# Patient Record
Sex: Female | Born: 1945
Health system: Southern US, Community
[De-identification: ages and names within clinical notes are randomized; demographics above are authoritative.]

## PROBLEM LIST (undated history)

## (undated) DIAGNOSIS — I10 Essential (primary) hypertension: Secondary | ICD-10-CM

## (undated) DIAGNOSIS — D649 Anemia, unspecified: Secondary | ICD-10-CM

## (undated) DIAGNOSIS — E785 Hyperlipidemia, unspecified: Secondary | ICD-10-CM

## (undated) HISTORY — DX: Hyperlipidemia, unspecified: E78.5

## (undated) HISTORY — DX: Anemia, unspecified: D64.9

## (undated) HISTORY — DX: Essential (primary) hypertension: I10

---

## 1999-04-13 ENCOUNTER — Other Ambulatory Visit: Admission: RE | Admit: 1999-04-13 | Discharge: 1999-04-13 | Payer: Self-pay | Admitting: *Deleted

## 1999-06-13 ENCOUNTER — Encounter (INDEPENDENT_AMBULATORY_CARE_PROVIDER_SITE_OTHER): Payer: Self-pay | Admitting: Specialist

## 1999-06-13 ENCOUNTER — Other Ambulatory Visit: Admission: RE | Admit: 1999-06-13 | Discharge: 1999-06-13 | Payer: Self-pay | Admitting: *Deleted

## 2000-01-01 ENCOUNTER — Other Ambulatory Visit: Admission: RE | Admit: 2000-01-01 | Discharge: 2000-01-01 | Payer: Self-pay | Admitting: *Deleted

## 2000-05-16 ENCOUNTER — Ambulatory Visit (HOSPITAL_COMMUNITY): Admission: RE | Admit: 2000-05-16 | Discharge: 2000-05-16 | Payer: Self-pay | Admitting: Internal Medicine

## 2000-05-16 ENCOUNTER — Encounter: Payer: Self-pay | Admitting: Internal Medicine

## 2000-08-20 ENCOUNTER — Encounter (INDEPENDENT_AMBULATORY_CARE_PROVIDER_SITE_OTHER): Payer: Self-pay | Admitting: Specialist

## 2000-08-20 ENCOUNTER — Other Ambulatory Visit: Admission: RE | Admit: 2000-08-20 | Discharge: 2000-08-20 | Payer: Self-pay | Admitting: *Deleted

## 2001-02-26 ENCOUNTER — Other Ambulatory Visit: Admission: RE | Admit: 2001-02-26 | Discharge: 2001-02-26 | Payer: Self-pay | Admitting: Gynecology

## 2003-06-09 ENCOUNTER — Other Ambulatory Visit: Admission: RE | Admit: 2003-06-09 | Discharge: 2003-06-09 | Payer: Self-pay | Admitting: Gynecology

## 2009-12-19 ENCOUNTER — Emergency Department (HOSPITAL_COMMUNITY): Admission: EM | Admit: 2009-12-19 | Discharge: 2009-12-19 | Payer: Self-pay | Admitting: Emergency Medicine

## 2011-02-07 LAB — POCT I-STAT, CHEM 8
BUN: 9 mg/dL (ref 6–23)
Calcium, Ion: 1.1 mmol/L — ABNORMAL LOW (ref 1.12–1.32)
Chloride: 104 mEq/L (ref 96–112)
Creatinine, Ser: 0.7 mg/dL (ref 0.4–1.2)
Glucose, Bld: 89 mg/dL (ref 70–99)
HCT: 48 % — ABNORMAL HIGH (ref 36.0–46.0)
Hemoglobin: 16.3 g/dL — ABNORMAL HIGH (ref 12.0–15.0)
Potassium: 4 mEq/L (ref 3.5–5.1)
Sodium: 140 mEq/L (ref 135–145)
TCO2: 31 mmol/L (ref 0–100)

## 2011-02-07 LAB — URINALYSIS, ROUTINE W REFLEX MICROSCOPIC
Bilirubin Urine: NEGATIVE
Glucose, UA: NEGATIVE mg/dL
Hgb urine dipstick: NEGATIVE
Ketones, ur: NEGATIVE mg/dL
Nitrite: NEGATIVE
Protein, ur: NEGATIVE mg/dL
Specific Gravity, Urine: 1.004 — ABNORMAL LOW (ref 1.005–1.030)
Urobilinogen, UA: 0.2 mg/dL (ref 0.0–1.0)
pH: 6 (ref 5.0–8.0)

## 2011-02-07 LAB — GLUCOSE, CAPILLARY: Glucose-Capillary: 88 mg/dL (ref 70–99)

## 2013-10-04 ENCOUNTER — Telehealth: Payer: Self-pay | Admitting: *Deleted

## 2013-10-04 NOTE — Telephone Encounter (Signed)
pts calling to see if we have samples of bystolic? For her( please call pt ).

## 2013-10-05 ENCOUNTER — Other Ambulatory Visit: Payer: Self-pay | Admitting: Emergency Medicine

## 2013-11-30 ENCOUNTER — Other Ambulatory Visit: Payer: Self-pay | Admitting: Internal Medicine

## 2014-02-06 ENCOUNTER — Encounter: Payer: Self-pay | Admitting: *Deleted

## 2014-02-06 DIAGNOSIS — I10 Essential (primary) hypertension: Secondary | ICD-10-CM | POA: Insufficient documentation

## 2014-02-06 DIAGNOSIS — E785 Hyperlipidemia, unspecified: Secondary | ICD-10-CM | POA: Insufficient documentation

## 2014-02-06 DIAGNOSIS — D649 Anemia, unspecified: Secondary | ICD-10-CM | POA: Insufficient documentation

## 2014-02-09 ENCOUNTER — Ambulatory Visit (INDEPENDENT_AMBULATORY_CARE_PROVIDER_SITE_OTHER): Payer: Medicare Other | Admitting: Emergency Medicine

## 2014-02-09 ENCOUNTER — Other Ambulatory Visit: Payer: Self-pay | Admitting: Emergency Medicine

## 2014-02-09 ENCOUNTER — Encounter: Payer: Self-pay | Admitting: Emergency Medicine

## 2014-02-09 VITALS — BP 118/70 | HR 80 | Temp 98.2°F | Resp 18 | Ht 65.5 in | Wt 203.0 lb

## 2014-02-09 DIAGNOSIS — I10 Essential (primary) hypertension: Secondary | ICD-10-CM

## 2014-02-09 DIAGNOSIS — R7309 Other abnormal glucose: Secondary | ICD-10-CM | POA: Diagnosis not present

## 2014-02-09 DIAGNOSIS — E559 Vitamin D deficiency, unspecified: Secondary | ICD-10-CM

## 2014-02-09 DIAGNOSIS — F17200 Nicotine dependence, unspecified, uncomplicated: Secondary | ICD-10-CM

## 2014-02-09 DIAGNOSIS — D649 Anemia, unspecified: Secondary | ICD-10-CM | POA: Diagnosis not present

## 2014-02-09 DIAGNOSIS — E538 Deficiency of other specified B group vitamins: Secondary | ICD-10-CM | POA: Diagnosis not present

## 2014-02-09 DIAGNOSIS — E782 Mixed hyperlipidemia: Secondary | ICD-10-CM | POA: Diagnosis not present

## 2014-02-09 LAB — CBC WITH DIFFERENTIAL/PLATELET
BASOS PCT: 0 % (ref 0–1)
Basophils Absolute: 0 10*3/uL (ref 0.0–0.1)
EOS PCT: 1 % (ref 0–5)
Eosinophils Absolute: 0.1 10*3/uL (ref 0.0–0.7)
HCT: 38 % (ref 36.0–46.0)
HEMOGLOBIN: 13.1 g/dL (ref 12.0–15.0)
LYMPHS ABS: 3.4 10*3/uL (ref 0.7–4.0)
Lymphocytes Relative: 30 % (ref 12–46)
MCH: 29.3 pg (ref 26.0–34.0)
MCHC: 34.5 g/dL (ref 30.0–36.0)
MCV: 85 fL (ref 78.0–100.0)
Monocytes Absolute: 0.6 10*3/uL (ref 0.1–1.0)
Monocytes Relative: 5 % (ref 3–12)
Neutro Abs: 7.2 10*3/uL (ref 1.7–7.7)
Neutrophils Relative %: 64 % (ref 43–77)
Platelets: 291 10*3/uL (ref 150–400)
RBC: 4.47 MIL/uL (ref 3.87–5.11)
RDW: 14.2 % (ref 11.5–15.5)
WBC: 11.2 10*3/uL — ABNORMAL HIGH (ref 4.0–10.5)

## 2014-02-09 LAB — HEMOGLOBIN A1C
Hgb A1c MFr Bld: 6 % — ABNORMAL HIGH (ref <5.7)
Mean Plasma Glucose: 126 mg/dL — ABNORMAL HIGH (ref <117)

## 2014-02-09 MED ORDER — OLMESARTAN MEDOXOMIL 40 MG PO TABS: 40.0000 mg | ORAL_TABLET | Freq: Every day | ORAL | Status: DC

## 2014-02-09 NOTE — Progress Notes (Signed)
Subjective:    Patient ID: Tamara Flores, female    DOB: 02/29/1946, 68 y.o.   MRN: [id number]  HPI Comments: 68 yo female presents for 3 month F/U for HTN, Cholesterol, Pre-Dm, D. Deficient. She is overdue for labs and f/u. She refuses CPE today. Last LABS T 214 TG 177 H 38 L 141   MAG .9 A1C 5.6 D 68 SHe has to go on/ off Livalo due to stomach issues. Crestor causes weakness but she wants to try to take 1-2 times a week and see if it will help her labs and see if she can tolerate it. She has trouble with taking magnesium daily also it causes stomach cramping despite her low levels. She has decreased cigarettes but continues to smoke. She has not been exercising routinely but keeps busy. She is trying to improve diet but often skips meals. Her BP has been good at home.        Medication List       This list is accurate as of: 02/09/14  2:43 PM.  Always use your most recent med list.               B-complex with vitamin C tablet  Take 1 tablet by mouth daily.     diazepam 2 MG tablet  Commonly known as:  VALIUM  TAKE 1/2 TO 1 TABLET 3 TIMES A DAY AS NEEDED     furosemide 40 MG tablet  Commonly known as:  LASIX  Take 40 mg by mouth daily.     nebivolol 10 MG tablet  Commonly known as:  BYSTOLIC  Take 10 mg by mouth daily.     olmesartan 20 MG tablet  Commonly known as:  BENICAR  Take 20 mg by mouth daily.     omeprazole 20 MG capsule  Commonly known as:  PRILOSEC  Take 20 mg by mouth daily.     Vitamin D 2000 UNITS tablet  Take 6,000 Units by mouth daily.       Allergies  Allergen Reactions  . Azithromycin     GI upset  . Crestor [Rosuvastatin]     GI upset  . Klonopin [Clonazepam]   . Lexapro [Escitalopram Oxalate]   . Lisinopril   . Lopid [Gemfibrozil]   . Losartan   . Magnesium-Containing Compounds     GI upset  . Prednisone   . Sulfa Antibiotics   . Vytorin [Ezetimibe-Simvastatin]   . Zoloft [Sertraline Hcl]    Past Medical History  Diagnosis Date   . Hypertension   . Hyperlipidemia   . Anemia       Review of Systems  Constitutional: Positive for fatigue.  Gastrointestinal: Positive for abdominal pain, diarrhea and constipation.  Musculoskeletal: Positive for arthralgias and myalgias.  All other systems reviewed and are negative.   BP 118/70  Pulse 80  Temp(Src) 98.2 F (36.8 C) (Temporal)  Resp 18  Ht 5' 5.5" (1.664 m)  Wt 203 lb (92.08 kg)  BMI 33.26 kg/m2     Objective:   Physical Exam  Nursing note and vitals reviewed. Constitutional: She is oriented to person, place, and time. She appears well-developed and well-nourished. No distress.  HENT:  Head: Normocephalic and atraumatic.  Right Ear: External ear normal.  Left Ear: External ear normal.  Nose: Nose normal.  Mouth/Throat: Oropharynx is clear and moist.  Eyes: Conjunctivae and EOM are normal.  Neck: Normal range of motion. Neck supple. No JVD present. No thyromegaly present.  Cardiovascular: Normal rate, regular rhythm, normal heart sounds and intact distal pulses.   Pulmonary/Chest: Effort normal and breath sounds normal.  Abdominal: Soft. Bowel sounds are normal. She exhibits no distension and no mass. There is no tenderness. There is no rebound and no guarding.  Musculoskeletal: Normal range of motion. She exhibits no edema and no tenderness.  Lymphadenopathy:    She has no cervical adenopathy.  Neurological: She is alert and oriented to person, place, and time. No cranial nerve deficit.  Skin: Skin is warm and dry. No rash noted. No erythema. No pallor.  Psychiatric: She has a normal mood and affect. Her behavior is normal. Judgment and thought content normal.          Assessment & Plan:  1.  3 month F/U for HTN, Cholesterol, Pre-Dm, D. Deficient. Needs healthy diet, cardio QD and obtain healthy weight. Check Labs, Check BP if >130/80 call office. SX Benicar/ Bystolic given take AD  2. Anemia- Recheck labs, take Vitamins AD increase green  leafy veggies, increase H2O, w/c if any symptom increase.  3. Tobacco dependence- cessation discussed

## 2014-02-09 NOTE — Patient Instructions (Signed)
Fat and Cholesterol Control Diet Fat and cholesterol levels in your blood and organs are influenced by your diet. High levels of fat and cholesterol may lead to diseases of the heart, small and large blood vessels, gallbladder, liver, and pancreas. CONTROLLING FAT AND CHOLESTEROL WITH DIET Although exercise and lifestyle factors are important, your diet is key. That is because certain foods are known to raise cholesterol and others to lower it. The goal is to balance foods for their effect on cholesterol and more importantly, to replace saturated and trans fat with other types of fat, such as monounsaturated fat, polyunsaturated fat, and omega-3 fatty acids. On average, a person should consume no more than 15 to 17 g of saturated fat daily. Saturated and trans fats are considered "bad" fats, and they will raise LDL cholesterol. Saturated fats are primarily found in animal products such as meats, butter, and cream. However, that does not mean you need to give up all your favorite foods. Today, there are good tasting, low-fat, low-cholesterol substitutes for most of the things you like to eat. Choose low-fat or nonfat alternatives. Choose round or loin cuts of red meat. These types of cuts are lowest in fat and cholesterol. Chicken (without the skin), fish, veal, and ground turkey breast are great choices. Eliminate fatty meats, such as hot dogs and salami. Even shellfish have little or no saturated fat. Have a 3 oz (85 g) portion when you eat lean meat, poultry, or fish. Trans fats are also called "partially hydrogenated oils." They are oils that have been scientifically manipulated so that they are solid at room temperature resulting in a longer shelf life and improved taste and texture of foods in which they are added. Trans fats are found in stick margarine, some tub margarines, cookies, crackers, and baked goods.  When baking and cooking, oils are a great substitute for butter. The monounsaturated oils are  especially beneficial since it is believed they lower LDL and raise HDL. The oils you should avoid entirely are saturated tropical oils, such as coconut and palm.  Remember to eat a lot from food groups that are naturally free of saturated and trans fat, including fish, fruit, vegetables, beans, grains (barley, rice, couscous, bulgur wheat), and pasta (without cream sauces).  IDENTIFYING FOODS THAT LOWER FAT AND CHOLESTEROL  Soluble fiber may lower your cholesterol. This type of fiber is found in fruits such as apples, vegetables such as broccoli, potatoes, and carrots, legumes such as beans, peas, and lentils, and grains such as barley. Foods fortified with plant sterols (phytosterol) may also lower cholesterol. You should eat at least 2 g per day of these foods for a cholesterol lowering effect.  Read package labels to identify low-saturated fats, trans fat free, and low-fat foods at the supermarket. Select cheeses that have only 2 to 3 g saturated fat per ounce. Use a heart-healthy tub margarine that is free of trans fats or partially hydrogenated oil. When buying baked goods (cookies, crackers), avoid partially hydrogenated oils. Breads and muffins should be made from whole grains (whole-wheat or whole oat flour, instead of "flour" or "enriched flour"). Buy non-creamy canned soups with reduced salt and no added fats.  FOOD PREPARATION TECHNIQUES  Never deep-fry. If you must fry, either stir-fry, which uses very little fat, or use non-stick cooking sprays. When possible, broil, bake, or roast meats, and steam vegetables. Instead of putting butter or margarine on vegetables, use lemon and herbs, applesauce, and cinnamon (for squash and sweet potatoes). Use nonfat   yogurt, salsa, and low-fat dressings for salads.  LOW-SATURATED FAT / LOW-FAT FOOD SUBSTITUTES Meats / Saturated Fat (g)  Avoid: Steak, marbled (3 oz/85 g) / 11 g  Choose: Steak, lean (3 oz/85 g) / 4 g  Avoid: Hamburger (3 oz/85 g) / 7  g  Choose: Hamburger, lean (3 oz/85 g) / 5 g  Avoid: Ham (3 oz/85 g) / 6 g  Choose: Ham, lean cut (3 oz/85 g) / 2.4 g  Avoid: Chicken, with skin, dark meat (3 oz/85 g) / 4 g  Choose: Chicken, skin removed, dark meat (3 oz/85 g) / 2 g  Avoid: Chicken, with skin, light meat (3 oz/85 g) / 2.5 g  Choose: Chicken, skin removed, light meat (3 oz/85 g) / 1 g Dairy / Saturated Fat (g)  Avoid: Whole milk (1 cup) / 5 g  Choose: Low-fat milk, 2% (1 cup) / 3 g  Choose: Low-fat milk, 1% (1 cup) / 1.5 g  Choose: Skim milk (1 cup) / 0.3 g  Avoid: Hard cheese (1 oz/28 g) / 6 g  Choose: Skim milk cheese (1 oz/28 g) / 2 to 3 g  Avoid: Cottage cheese, 4% fat (1 cup) / 6.5 g  Choose: Low-fat cottage cheese, 1% fat (1 cup) / 1.5 g  Avoid: Ice cream (1 cup) / 9 g  Choose: Sherbet (1 cup) / 2.5 g  Choose: Nonfat frozen yogurt (1 cup) / 0.3 g  Choose: Frozen fruit bar / trace  Avoid: Whipped cream (1 tbs) / 3.5 g  Choose: Nondairy whipped topping (1 tbs) / 1 g Condiments / Saturated Fat (g)  Avoid: Mayonnaise (1 tbs) / 2 g  Choose: Low-fat mayonnaise (1 tbs) / 1 g  Avoid: Butter (1 tbs) / 7 g  Choose: Extra light margarine (1 tbs) / 1 g  Avoid: Coconut oil (1 tbs) / 11.8 g  Choose: Olive oil (1 tbs) / 1.8 g  Choose: Corn oil (1 tbs) / 1.7 g  Choose: Safflower oil (1 tbs) / 1.2 g  Choose: Sunflower oil (1 tbs) / 1.4 g  Choose: Soybean oil (1 tbs) / 2.4 g  Choose: Canola oil (1 tbs) / 1 g Document Released: 11/04/2005 Document Revised: 03/01/2013 Document Reviewed: 04/25/2011 [employer] Patient Information 2014 [place], [place]. Hypertension Hypertension is another name for high blood pressure. High blood pressure may mean that your heart needs to work harder to pump blood. Blood pressure consists of two numbers, which includes a higher number over a lower number (example: 110/72). HOME CARE   Make lifestyle changes as told by your doctor. This may include weight loss and  exercise.  Take your blood pressure medicine every day.  Limit how much salt you use.  Stop smoking if you smoke.  Do not use drugs.  Talk to your doctor if you are using decongestants or birth control pills. These medicines might make blood pressure higher.  Females should not drink more than 1 alcoholic drink per day. Males should not drink more than 2 alcoholic drinks per day.  See your doctor as told. GET HELP RIGHT AWAY IF:   You have a blood pressure reading with a top number of 180 or higher.  You get a very bad headache.  You get blurred or changing vision.  You feel confused.  You feel weak, numb, or faint.  You get chest or belly (abdominal) pain.  You throw up (vomit).  You cannot breathe very well. MAKE SURE YOU:   Understand these  instructions.  Will watch your condition.  Will get help right away if you are not doing well or get worse. Document Released: 04/22/2008 Document Revised: 01/27/2012 Document Reviewed: 04/22/2008 [facility] Patient Information 2014 [place], [place].

## 2014-02-10 LAB — BASIC METABOLIC PANEL WITH GFR
BUN: 12 mg/dL (ref 6–23)
CO2: 29 mEq/L (ref 19–32)
CREATININE: 0.74 mg/dL (ref 0.50–1.10)
Calcium: 7.4 mg/dL — ABNORMAL LOW (ref 8.4–10.5)
Chloride: 102 mEq/L (ref 96–112)
GFR, Est Non African American: 85 mL/min
Glucose, Bld: 72 mg/dL (ref 70–99)
Potassium: 3.8 mEq/L (ref 3.5–5.3)
Sodium: 143 mEq/L (ref 135–145)

## 2014-02-10 LAB — MAGNESIUM: Magnesium: 0.6 mg/dL — ABNORMAL LOW (ref 1.5–2.5)

## 2014-02-10 LAB — HEPATIC FUNCTION PANEL
ALK PHOS: 93 U/L (ref 39–117)
ALT: 12 U/L (ref 0–35)
AST: 15 U/L (ref 0–37)
Albumin: 3.9 g/dL (ref 3.5–5.2)
BILIRUBIN DIRECT: 0.1 mg/dL (ref 0.0–0.3)
BILIRUBIN TOTAL: 0.3 mg/dL (ref 0.2–1.2)
Indirect Bilirubin: 0.2 mg/dL (ref 0.2–1.2)
Total Protein: 6.3 g/dL (ref 6.0–8.3)

## 2014-02-10 LAB — VITAMIN B12: Vitamin B-12: 482 pg/mL (ref 211–911)

## 2014-02-10 LAB — LIPID PANEL
Cholesterol: 204 mg/dL — ABNORMAL HIGH (ref 0–200)
HDL: 32 mg/dL — ABNORMAL LOW (ref >39)
LDL CALC: 123 mg/dL — AB (ref 0–99)
Total CHOL/HDL Ratio: 6.4 Ratio
Triglycerides: 243 mg/dL — ABNORMAL HIGH (ref <150)
VLDL: 49 mg/dL — ABNORMAL HIGH (ref 0–40)

## 2014-02-10 LAB — INSULIN, FASTING: Insulin fasting, serum: 22 u[IU]/mL (ref 3–28)

## 2014-02-10 LAB — VITAMIN D 25 HYDROXY (VIT D DEFICIENCY, FRACTURES): Vit D, 25-Hydroxy: 73 ng/mL (ref 30–89)

## 2014-02-17 ENCOUNTER — Other Ambulatory Visit: Payer: Self-pay | Admitting: Emergency Medicine

## 2014-02-17 DIAGNOSIS — E8940 Asymptomatic postprocedural ovarian failure: Secondary | ICD-10-CM

## 2014-02-22 ENCOUNTER — Ambulatory Visit (INDEPENDENT_AMBULATORY_CARE_PROVIDER_SITE_OTHER): Payer: Medicare Other | Admitting: *Deleted

## 2014-02-22 DIAGNOSIS — R6889 Other general symptoms and signs: Secondary | ICD-10-CM | POA: Diagnosis not present

## 2014-02-22 LAB — CBC WITH DIFFERENTIAL/PLATELET
Basophils Absolute: 0 10*3/uL (ref 0.0–0.1)
Basophils Relative: 0 % (ref 0–1)
EOS PCT: 2 % (ref 0–5)
Eosinophils Absolute: 0.2 10*3/uL (ref 0.0–0.7)
HCT: 39 % (ref 36.0–46.0)
HEMOGLOBIN: 13.1 g/dL (ref 12.0–15.0)
LYMPHS ABS: 2.8 10*3/uL (ref 0.7–4.0)
LYMPHS PCT: 29 % (ref 12–46)
MCH: 29.2 pg (ref 26.0–34.0)
MCHC: 33.6 g/dL (ref 30.0–36.0)
MCV: 87.1 fL (ref 78.0–100.0)
MONOS PCT: 5 % (ref 3–12)
Monocytes Absolute: 0.5 10*3/uL (ref 0.1–1.0)
Neutro Abs: 6.1 10*3/uL (ref 1.7–7.7)
Neutrophils Relative %: 64 % (ref 43–77)
PLATELETS: 266 10*3/uL (ref 150–400)
RBC: 4.48 MIL/uL (ref 3.87–5.11)
RDW: 14.5 % (ref 11.5–15.5)
WBC: 9.5 10*3/uL (ref 4.0–10.5)

## 2014-02-22 LAB — BASIC METABOLIC PANEL WITH GFR
BUN: 9 mg/dL (ref 6–23)
CHLORIDE: 104 meq/L (ref 96–112)
CO2: 28 mEq/L (ref 19–32)
Calcium: 9.1 mg/dL (ref 8.4–10.5)
Creat: 0.76 mg/dL (ref 0.50–1.10)
GFR, Est African American: >89 mL/min
GFR, Est Non African American: 83 mL/min
GLUCOSE: 90 mg/dL (ref 70–99)
POTASSIUM: 4 meq/L (ref 3.5–5.3)
SODIUM: 141 meq/L (ref 135–145)

## 2014-02-23 ENCOUNTER — Encounter: Payer: Self-pay | Admitting: Emergency Medicine

## 2014-02-23 ENCOUNTER — Ambulatory Visit (INDEPENDENT_AMBULATORY_CARE_PROVIDER_SITE_OTHER): Payer: Medicare Other | Admitting: Emergency Medicine

## 2014-02-23 VITALS — BP 142/86 | HR 84 | Temp 98.2°F | Resp 18 | Ht 65.5 in | Wt 203.0 lb

## 2014-02-23 DIAGNOSIS — R0609 Other forms of dyspnea: Secondary | ICD-10-CM

## 2014-02-23 DIAGNOSIS — R0989 Other specified symptoms and signs involving the circulatory and respiratory systems: Secondary | ICD-10-CM

## 2014-02-23 DIAGNOSIS — R05 Cough: Secondary | ICD-10-CM | POA: Diagnosis not present

## 2014-02-23 DIAGNOSIS — R059 Cough, unspecified: Secondary | ICD-10-CM

## 2014-02-23 DIAGNOSIS — J309 Allergic rhinitis, unspecified: Secondary | ICD-10-CM | POA: Diagnosis not present

## 2014-02-23 DIAGNOSIS — R0600 Dyspnea, unspecified: Secondary | ICD-10-CM

## 2014-02-23 LAB — PTH, INTACT AND CALCIUM
Calcium: 8.6 mg/dL (ref 8.4–10.5)
PTH: 43.7 pg/mL (ref 14.0–72.0)

## 2014-02-23 MED ORDER — METHYLPREDNISOLONE (PAK) 4 MG PO TABS: 4.0000 mg | ORAL_TABLET | Freq: Every day | ORAL | Status: DC

## 2014-02-23 NOTE — Patient Instructions (Signed)
Allergic Rhinitis Allergic rhinitis is when the mucous membranes in the nose respond to allergens. Allergens are particles in the air that cause your body to have an allergic reaction. This causes you to release allergic antibodies. Through a chain of events, these eventually cause you to release histamine into the blood stream. Although meant to protect the body, it is this release of histamine that causes your discomfort, such as frequent sneezing, congestion, and an itchy, runny nose.  CAUSES  Seasonal allergic rhinitis (hay fever) is caused by pollen allergens that may come from grasses, trees, and weeds. Year-round allergic rhinitis (perennial allergic rhinitis) is caused by allergens such as house dust mites, pet dander, and mold spores.  SYMPTOMS   Nasal stuffiness (congestion).  Itchy, runny nose with sneezing and tearing of the eyes. DIAGNOSIS  Your health care provider can help you determine the allergen or allergens that trigger your symptoms. If you and your health care provider are unable to determine the allergen, skin or blood testing may be used. TREATMENT  Allergic Rhinitis does not have a cure, but it can be controlled by:  Medicines and allergy shots (immunotherapy).  Avoiding the allergen. Hay fever may often be treated with antihistamines in pill or nasal spray forms. Antihistamines block the effects of histamine. There are over-the-counter medicines that may help with nasal congestion and swelling around the eyes. Check with your health care provider before taking or giving this medicine.  If avoiding the allergen or the medicine prescribed do not work, there are many new medicines your health care provider can prescribe. Stronger medicine may be used if initial measures are ineffective. Desensitizing injections can be used if medicine and avoidance does not work. Desensitization is when a patient is given ongoing shots until the body becomes less sensitive to the allergen.  Make sure you follow up with your health care provider if problems continue. HOME CARE INSTRUCTIONS It is not possible to completely avoid allergens, but you can reduce your symptoms by taking steps to limit your exposure to them. It helps to know exactly what you are allergic to so that you can avoid your specific triggers. SEEK MEDICAL CARE IF:   You have a fever.  You develop a cough that does not stop easily (persistent).  You have shortness of breath.  You start wheezing.  Symptoms interfere with normal daily activities. Document Released: 07/30/2001 Document Revised: 08/25/2013 Document Reviewed: 07/12/2013 [facility] Patient Information 2014 [place]. Bronchitis Bronchitis is swelling (inflammation) of the air tubes leading to your lungs (bronchi). This causes mucus and a cough. If the swelling gets bad, you may have trouble breathing. HOME CARE   Rest.  Drink enough fluids to keep your pee (urine) clear or pale yellow (unless you have a condition where you have to watch how much you drink).  Only take medicine as told by your doctor. If you were given antibiotic medicines, finish them even if you start to feel better.  Avoid smoke, irritating chemicals, and strong smells. These make the problem worse. Quit smoking if you smoke. This helps your lungs heal faster.  Use a cool mist humidifier. Change the water in the humidifier every day. You can also sit in the bathroom with hot shower running for 5 10 minutes. Keep the door closed.  See your health care provider as told.  Wash your hands often. GET HELP IF: Your problems do not get better after 1 week. GET HELP RIGHT AWAY IF:   Your fever gets  worse.  You have chills.  Your chest hurts.  Your problems breathing get worse.  You have blood in your mucus.  You pass out (faint).  You feel lightheaded.  You have a bad headache.  You throw up (vomit) again and again. MAKE SURE YOU:  Understand these  instructions.  Will watch your condition.  Will get help right away if you are not doing well or get worse. Document Released: 04/22/2008 Document Revised: 08/25/2013 Document Reviewed: 06/29/2013 [facility] Patient Information 2014 [place], [place].

## 2014-02-23 NOTE — Progress Notes (Signed)
   Subjective:    Patient ID: Tamara Flores, female    DOB: Oct 25, 1946, 68 y.o.   MRN: [id number]  HPI Comments: 68 yo female with increased cough with thick clear production x 3 weeks. She has started Childrens mucinex has helped with production that is clear. She notes cough is hard and deep and strains voice. She notes she is feeling better since d/c fluid pill with recent abnormal labs. She has added vitamins AD and PTH graph still pending.     Cough Associated symptoms include postnasal drip and shortness of breath.  Sore Throat  Associated symptoms include coughing and shortness of breath.  Shortness of Breath      Review of Systems  HENT: Positive for postnasal drip and voice change.   Respiratory: Positive for cough and shortness of breath.   All other systems reviewed and are negative.  BP 142/86  Pulse 84  Temp(Src) 98.2 F (36.8 C) (Temporal)  Resp 18  Ht 5' 5.5" (1.664 m)  Wt 203 lb (92.08 kg)  BMI 33.26 kg/m2  SpO2 95%     Objective:   Physical Exam  Nursing note and vitals reviewed. Constitutional: She is oriented to person, place, and time. She appears well-developed and well-nourished.  HENT:  Head: Normocephalic and atraumatic.  Right Ear: External ear normal.  Left Ear: External ear normal.  Nose: Nose normal.  Mouth/Throat: Oropharynx is clear and moist. No oropharyngeal exudate.  Cloudy TM's bilaterally   Eyes: Conjunctivae and EOM are normal.  Neck: Normal range of motion.  Cardiovascular: Normal rate, regular rhythm, normal heart sounds and intact distal pulses.   Pulmonary/Chest: Effort normal and breath sounds normal.  Congested Breath sounds, clears some with cough  Musculoskeletal: Normal range of motion.  Lymphadenopathy:    She has no cervical adenopathy.  Neurological: She is alert and oriented to person, place, and time.  Skin: Skin is warm and dry.  Psychiatric: She has a normal mood and affect. Judgment normal.           Assessment & Plan:  1. Cough with SOB with coughing episodes/ Allergic rhinitis- Zyrtec 5 mg OTC, Flonase NS AD, continue Mucinex AD, add Medrol DP AD, increase H2o, allergy hygiene explained. Declines CXR or BNP lab. w/c if SX increase or ER.

## 2014-02-24 ENCOUNTER — Other Ambulatory Visit: Payer: Self-pay | Admitting: Internal Medicine

## 2014-03-23 ENCOUNTER — Ambulatory Visit: Payer: Self-pay | Admitting: Internal Medicine

## 2014-04-06 ENCOUNTER — Ambulatory Visit: Payer: Self-pay | Admitting: Internal Medicine

## 2014-04-20 ENCOUNTER — Other Ambulatory Visit: Payer: Self-pay | Admitting: Internal Medicine

## 2014-04-24 DIAGNOSIS — J019 Acute sinusitis, unspecified: Secondary | ICD-10-CM | POA: Diagnosis not present

## 2014-04-24 DIAGNOSIS — J309 Allergic rhinitis, unspecified: Secondary | ICD-10-CM | POA: Diagnosis not present

## 2014-04-24 DIAGNOSIS — J209 Acute bronchitis, unspecified: Secondary | ICD-10-CM | POA: Diagnosis not present

## 2014-06-15 ENCOUNTER — Encounter: Payer: Self-pay | Admitting: Internal Medicine

## 2014-06-15 ENCOUNTER — Ambulatory Visit (INDEPENDENT_AMBULATORY_CARE_PROVIDER_SITE_OTHER): Payer: Medicare Other | Admitting: Internal Medicine

## 2014-06-15 VITALS — BP 160/72 | HR 88 | Temp 98.0°F | Resp 16 | Ht 65.5 in | Wt 204.0 lb

## 2014-06-15 DIAGNOSIS — Z79899 Other long term (current) drug therapy: Secondary | ICD-10-CM | POA: Diagnosis not present

## 2014-06-15 DIAGNOSIS — E559 Vitamin D deficiency, unspecified: Secondary | ICD-10-CM | POA: Diagnosis not present

## 2014-06-15 DIAGNOSIS — R7303 Prediabetes: Secondary | ICD-10-CM | POA: Insufficient documentation

## 2014-06-15 DIAGNOSIS — E785 Hyperlipidemia, unspecified: Secondary | ICD-10-CM

## 2014-06-15 DIAGNOSIS — R7309 Other abnormal glucose: Secondary | ICD-10-CM | POA: Diagnosis not present

## 2014-06-15 DIAGNOSIS — I10 Essential (primary) hypertension: Secondary | ICD-10-CM | POA: Diagnosis not present

## 2014-06-15 LAB — CBC WITH DIFFERENTIAL/PLATELET
BASOS ABS: 0 10*3/uL (ref 0.0–0.1)
BASOS PCT: 0 % (ref 0–1)
EOS PCT: 1 % (ref 0–5)
Eosinophils Absolute: 0.1 10*3/uL (ref 0.0–0.7)
HEMATOCRIT: 41.8 % (ref 36.0–46.0)
Hemoglobin: 14.7 g/dL (ref 12.0–15.0)
Lymphocytes Relative: 30 % (ref 12–46)
Lymphs Abs: 3.1 10*3/uL (ref 0.7–4.0)
MCH: 29.8 pg (ref 26.0–34.0)
MCHC: 35.2 g/dL (ref 30.0–36.0)
MCV: 84.8 fL (ref 78.0–100.0)
MONOS PCT: 6 % (ref 3–12)
Monocytes Absolute: 0.6 10*3/uL (ref 0.1–1.0)
Neutro Abs: 6.5 10*3/uL (ref 1.7–7.7)
Neutrophils Relative %: 63 % (ref 43–77)
Platelets: 307 10*3/uL (ref 150–400)
RBC: 4.93 MIL/uL (ref 3.87–5.11)
RDW: 14 % (ref 11.5–15.5)
WBC: 10.3 10*3/uL (ref 4.0–10.5)

## 2014-06-15 LAB — HEMOGLOBIN A1C
HEMOGLOBIN A1C: 6.1 % — AB (ref <5.7)
Mean Plasma Glucose: 128 mg/dL — ABNORMAL HIGH (ref <117)

## 2014-06-15 MED ORDER — PITAVASTATIN CALCIUM 2 MG PO TABS: 2.0000 mg | ORAL_TABLET | Freq: Every day | ORAL | Status: DC

## 2014-06-15 MED ORDER — BETAMETHASONE DIPROPIONATE AUG 0.05 % EX LOTN: TOPICAL_LOTION | Freq: Two times a day (BID) | CUTANEOUS | Status: DC

## 2014-06-15 MED ORDER — OLMESARTAN MEDOXOMIL 40 MG PO TABS: 40.0000 mg | ORAL_TABLET | Freq: Every day | ORAL | Status: DC

## 2014-06-15 NOTE — Progress Notes (Signed)
Patient ID: Tamara Flores, female   DOB: [birth year], 68 y.o.   MRN: [id number]   This very nice 68 y.o.female presents for 3 month follow up with Hypertension, Hyperlipidemia, Pre-Diabetes and Vitamin D Deficiency.    Patient is treated for HTN since Apr 2010 & BP has not been  controlled at home. Today's BP: 160/72 mmHg. 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   Hyperlipidemia is controlled with diet & meds. Patient denies myalgias or other med SE's. Last Lipids were 02/09/2014: Cholesterol, Total 204*; HDL Cholesterol by NMR 32*; LDL (calc) 123*; Triglycerides 243* .Patient has been intolerant to most statins , Lopid and Zetia. She does feel she can take livalo with periodid drug holidays.   Also, the patient has history of PreDiabetes and last A1c was 02/09/2014: Hemoglobin-A1c 6.0* . Patient denies any symptoms of reactive hypoglycemia, diabetic polys, paresthesias or visual blurring.   Further, Patient has history of Vitamin D Deficiency and last vitamin D was 02/09/2014: Vit D, 25-Hydroxy 73 . Patient supplements vitamin D without any suspected side-effects.   Medication List   B-complex with vitamin C tablet  Take 1 tablet by mouth daily.     betamethasone (augmented) 0.05 % lotion  Commonly known as:  DIPROLENE  Apply topically 2 (two) times daily.     diazepam 2 MG tablet  Commonly known as:  VALIUM  TAKE 1/2 TO 1 TABLET BY MOUTH 3 TIMES DAILY AS NEEDED     nebivolol 10 MG tablet  Commonly known as:  BYSTOLIC  Take 10 mg by mouth daily.     olmesartan 40 MG tablet  Commonly known as:  BENICAR  Take 1 tablet (40 mg total) by mouth daily.     omeprazole 20 MG capsule  Commonly known as:  PRILOSEC  Take 20 mg by mouth daily.     OVER THE COUNTER MEDICATION  Calcium with magnesium and zinc     Pitavastatin  Calcium 2 MG Tabs - (sporatic)  Commonly known as:  LIVALO  Take 1 tablet (2 mg total) by mouth daily.     Vitamin D 2000 UNITS tablet  Take 6,000 Units by mouth daily.      Allergies  Allergen Reactions  . Azithromycin     GI upset  . Crestor [Rosuvastatin]     GI upset  . Klonopin [Clonazepam]   . Lexapro [Escitalopram Oxalate]   . Lisinopril   . Lopid [Gemfibrozil]   . Losartan   . Magnesium-Containing Compounds     GI upset  . Prednisone   . Sulfa Antibiotics   . Vytorin [Ezetimibe-Simvastatin]   . Zoloft [Sertraline Hcl]    PMHx:   Past Medical History  Diagnosis Date  . Hypertension   . Hyperlipidemia   . Anemia    FHx:    Reviewed / unchanged SHx:    Reviewed / unchanged  Systems Review:  Constitutional: Denies fever, chills, wt changes, headaches, insomnia, fatigue, night sweats, change in appetite. Eyes: Denies redness, blurred vision, diplopia, discharge, itchy, watery eyes.  ENT: Denies discharge, congestion, post nasal drip, epistaxis, sore throat, earache, hearing loss, dental pain, tinnitus, vertigo, sinus pain, snoring.  CV: Denies chest pain, palpitations, irregular heartbeat, syncope, dyspnea, diaphoresis, orthopnea, PND, claudication or edema. Respiratory: denies cough, dyspnea, DOE, pleurisy, hoarseness, laryngitis, wheezing.  Gastrointestinal: Denies dysphagia, odynophagia, heartburn, reflux, water brash, abdominal pain or  cramps, nausea, vomiting, bloating, diarrhea, constipation, hematemesis, melena, hematochezia  or hemorrhoids. Genitourinary: Denies dysuria, frequency, urgency, nocturia, hesitancy, discharge, hematuria or flank pain. Musculoskeletal: Denies arthralgias, myalgias, stiffness, jt. swelling, pain, limping or strain/sprain.  Skin: Denies pruritus, rash, hives, warts, acne, eczema or change in skin lesion(s). Neuro: No weakness, tremor, incoordination, spasms, paresthesia or pain. Psychiatric: Denies confusion, memory loss or sensory  loss. Endo: Denies change in weight, skin or hair change.  Heme/Lymph: No excessive bleeding, bruising or enlarged lymph nodes.  Exam:  BP 160/72  Pulse 88  Temp(Src) 98 F (36.7 C) (Temporal)  Resp 16  Ht 5' 5.5" (1.664 m)  Wt 204 lb (92.534 kg)  BMI 33.42 kg/m2  Appears well nourished and in no distress. Eyes: PERRLA, EOMs, conjunctiva no swelling or erythema. Sinuses: No frontal/maxillary tenderness ENT/Mouth: EAC's clear, TM's nl w/o erythema, bulging. Nares clear w/o erythema, swelling, exudates. Oropharynx clear without erythema or exudates. Oral hygiene is good. Tongue normal, non obstructing. Hearing intact.  Neck: Supple. Thyroid nl. Car 2+/2+ without bruits, nodes or JVD. Chest: Respirations nl with BS clear & equal w/o rales, rhonchi, wheezing or stridor.  Cor: Heart sounds normal w/ regular rate and rhythm without sig. murmurs, gallops, clicks, or rubs. Peripheral pulses normal and equal  without edema.  Abdomen: Soft & bowel sounds normal. Non-tender w/o guarding, rebound, hernias, masses, or organomegaly.  Lymphatics: Unremarkable.  Musculoskeletal: Full ROM all peripheral extremities, joint stability, 5/5 strength, and normal gait.  Skin: Warm, dry without exposed rashes, lesions or ecchymosis apparent.  Neuro: Cranial nerves intact, reflexes equal bilaterally. Sensory-motor testing grossly intact. Tendon reflexes grossly intact.  Pysch: Alert & oriented x 3. Insight and judgement nl & appropriate. No ideations.  Assessment and Plan:  1. Hypertension - Continue monitor blood pressure at home. Continue diet/meds same.  2. Hyperlipidemia - Continue diet/meds, exercise,& lifestyle modifications. Continue monitor periodic cholesterol/liver & renal functions. Suggested try Livalo  4 x wk on MWF&Sat. Also, discussed stricter No Cholesterol diet.  3. Pre-Diabetes - Continue diet, exercise, lifestyle modifications. Monitor appropriate labs.  4. Vitamin D Deficiency -  Continue supplementation.  Recommended regular exercise, BP monitoring, weight control, and discussed med and SE's. Recommended labs to assess and monitor clinical status. Further disposition pending results of labs.

## 2014-06-15 NOTE — Patient Instructions (Signed)

## 2014-06-16 LAB — BASIC METABOLIC PANEL WITH GFR
BUN: 11 mg/dL (ref 6–23)
CALCIUM: 9.5 mg/dL (ref 8.4–10.5)
CO2: 24 mEq/L (ref 19–32)
Chloride: 104 mEq/L (ref 96–112)
Creat: 0.82 mg/dL (ref 0.50–1.10)
GFR, EST AFRICAN AMERICAN: 87 mL/min
GFR, EST NON AFRICAN AMERICAN: 75 mL/min
Glucose, Bld: 92 mg/dL (ref 70–99)
Potassium: 4.4 mEq/L (ref 3.5–5.3)
Sodium: 138 mEq/L (ref 135–145)

## 2014-06-16 LAB — VITAMIN D 25 HYDROXY (VIT D DEFICIENCY, FRACTURES): Vit D, 25-Hydroxy: 81 ng/mL (ref 30–89)

## 2014-06-16 LAB — INSULIN, FASTING: INSULIN FASTING, SERUM: 25 u[IU]/mL (ref 3–28)

## 2014-06-16 LAB — HEPATIC FUNCTION PANEL
ALBUMIN: 4 g/dL (ref 3.5–5.2)
ALT: 12 U/L (ref 0–35)
AST: 12 U/L (ref 0–37)
Alkaline Phosphatase: 102 U/L (ref 39–117)
BILIRUBIN INDIRECT: 0.3 mg/dL (ref 0.2–1.2)
Bilirubin, Direct: 0.1 mg/dL (ref 0.0–0.3)
TOTAL PROTEIN: 6.8 g/dL (ref 6.0–8.3)
Total Bilirubin: 0.4 mg/dL (ref 0.2–1.2)

## 2014-06-16 LAB — LIPID PANEL
CHOLESTEROL: 226 mg/dL — AB (ref 0–200)
HDL: 41 mg/dL (ref >39)
LDL Cholesterol: 153 mg/dL — ABNORMAL HIGH (ref 0–99)
Total CHOL/HDL Ratio: 5.5 Ratio
Triglycerides: 160 mg/dL — ABNORMAL HIGH (ref <150)
VLDL: 32 mg/dL (ref 0–40)

## 2014-06-16 LAB — TSH: TSH: 0.922 u[IU]/mL (ref 0.350–4.500)

## 2014-06-16 LAB — MAGNESIUM: MAGNESIUM: 1.4 mg/dL — AB (ref 1.5–2.5)

## 2014-07-19 ENCOUNTER — Other Ambulatory Visit: Payer: Self-pay | Admitting: Internal Medicine

## 2014-08-01 ENCOUNTER — Other Ambulatory Visit: Payer: Self-pay | Admitting: Internal Medicine

## 2014-09-21 ENCOUNTER — Ambulatory Visit: Payer: Self-pay | Admitting: Internal Medicine

## 2014-09-26 ENCOUNTER — Encounter: Payer: Self-pay | Admitting: Physician Assistant

## 2014-09-26 ENCOUNTER — Ambulatory Visit (INDEPENDENT_AMBULATORY_CARE_PROVIDER_SITE_OTHER): Payer: Medicare Other | Admitting: Physician Assistant

## 2014-09-26 VITALS — BP 122/80 | HR 76 | Temp 97.9°F | Resp 16 | Ht 65.5 in | Wt 200.0 lb

## 2014-09-26 DIAGNOSIS — Z23 Encounter for immunization: Secondary | ICD-10-CM | POA: Diagnosis not present

## 2014-09-26 DIAGNOSIS — E669 Obesity, unspecified: Secondary | ICD-10-CM | POA: Insufficient documentation

## 2014-09-26 DIAGNOSIS — Z0001 Encounter for general adult medical examination with abnormal findings: Secondary | ICD-10-CM | POA: Diagnosis not present

## 2014-09-26 DIAGNOSIS — D649 Anemia, unspecified: Secondary | ICD-10-CM

## 2014-09-26 DIAGNOSIS — E785 Hyperlipidemia, unspecified: Secondary | ICD-10-CM

## 2014-09-26 DIAGNOSIS — R7303 Prediabetes: Secondary | ICD-10-CM

## 2014-09-26 DIAGNOSIS — Z79899 Other long term (current) drug therapy: Secondary | ICD-10-CM | POA: Diagnosis not present

## 2014-09-26 DIAGNOSIS — R6889 Other general symptoms and signs: Secondary | ICD-10-CM | POA: Diagnosis not present

## 2014-09-26 DIAGNOSIS — Z1331 Encounter for screening for depression: Secondary | ICD-10-CM

## 2014-09-26 DIAGNOSIS — F17200 Nicotine dependence, unspecified, uncomplicated: Secondary | ICD-10-CM

## 2014-09-26 DIAGNOSIS — R7309 Other abnormal glucose: Secondary | ICD-10-CM | POA: Diagnosis not present

## 2014-09-26 DIAGNOSIS — Z789 Other specified health status: Secondary | ICD-10-CM

## 2014-09-26 DIAGNOSIS — I10 Essential (primary) hypertension: Secondary | ICD-10-CM | POA: Diagnosis not present

## 2014-09-26 DIAGNOSIS — E559 Vitamin D deficiency, unspecified: Secondary | ICD-10-CM

## 2014-09-26 LAB — CBC WITH DIFFERENTIAL/PLATELET
BASOS PCT: 0 % (ref 0–1)
Basophils Absolute: 0 10*3/uL (ref 0.0–0.1)
EOS ABS: 0.1 10*3/uL (ref 0.0–0.7)
EOS PCT: 1 % (ref 0–5)
HCT: 45 % (ref 36.0–46.0)
HEMOGLOBIN: 15.4 g/dL — AB (ref 12.0–15.0)
Lymphocytes Relative: 29 % (ref 12–46)
Lymphs Abs: 3.2 10*3/uL (ref 0.7–4.0)
MCH: 30 pg (ref 26.0–34.0)
MCHC: 34.2 g/dL (ref 30.0–36.0)
MCV: 87.7 fL (ref 78.0–100.0)
MONOS PCT: 6 % (ref 3–12)
Monocytes Absolute: 0.7 10*3/uL (ref 0.1–1.0)
NEUTROS PCT: 64 % (ref 43–77)
Neutro Abs: 7 10*3/uL (ref 1.7–7.7)
Platelets: 277 10*3/uL (ref 150–400)
RBC: 5.13 MIL/uL — ABNORMAL HIGH (ref 3.87–5.11)
RDW: 14.6 % (ref 11.5–15.5)
WBC: 11 10*3/uL — ABNORMAL HIGH (ref 4.0–10.5)

## 2014-09-26 NOTE — Patient Instructions (Signed)
Please get [facility] Please consider cologuard Cologuard is an easy to use noninvasive colon cancer screening test based on the latest advances in stool DNA science.   Colon cancer is 3rd most diagnosed cancer and 2nd leading cause of death in both men and women 66 years of age and older despite being one of the most preventable and treatable cancers if found early. You have agreed to do a Cologuard screening and have declined a colonoscopy in spite of being explained the risks and benefits of the colonoscopy in detail, including cancer and death. Please understand that this is test not as sensitive or specific as a colonoscopy and you are still recommended to get a colonoscopy.   If you are NOT medicare please call your insurance company and given them this CPT code, [phone], in order to see how much your insurance company will cover.   You will receive a short call from [place] support center at [employer], when you receive a call they will say they are from [place],  to confirm your mailing address and give you more information.  When they calll you, it will appear on the caller ID as "[employer]" or in some cases only this number will appear, [phone].   [employer] will ship your collection kit directly to you. You will collect a single stool sample in the privacy of your own home, no special preparation required. You will return the kit via [place] pre-paid shipping or pick-up, in the same box it arrived in. Then I will contact you to discuss your results after I receive them from the laboratory.   If you have any questions or concerns, Cologuard Customer Support Specialist are available 24 hours a day, 7 days a week at [phone] or go to [email or web address].          Bad carbs also include fruit juice, alcohol, and sweet tea. These are empty calories that do not signal to your brain that you are full.   Please remember the good  carbs are still carbs which convert into sugar. So please measure them out no more than 1/2-1 cup of rice, oatmeal, pasta, and beans  Veggies are however free foods! Pile them on.   Not all fruit is created equal. Please see the list below, the fruit at the bottom is higher in sugars than the fruit at the top. Please avoid all dried fruits.

## 2014-09-26 NOTE — Progress Notes (Signed)
WELCOME TO MEDICARE WELLNESS VISIT AND FOLLOW UP  Assessment:   1. Essential hypertension - continue medications, DASH diet, exercise and monitor at home. Call if greater than 130/80.  - CBC with Differential - BASIC METABOLIC PANEL WITH GFR - Hepatic function panel - TSH  2. Hyperlipidemia -continue medications, check lipids, decrease fatty foods, increase activity. - Lipid panel  3. Prediabetes Discussed general issues about diabetes pathophysiology and management., Educational material distributed., Suggested low cholesterol diet., Encouraged aerobic exercise., Discussed foot care., Reminded to get yearly retinal exam. - Hemoglobin A1c - Insulin, fasting - HM DIABETES FOOT EXAM  4. Vitamin D deficiency  5. Medication management - Magnesium  6. Anemia, unspecified anemia type - CBC with Differential  7. Obesity Obesity with co morbidities- long discussion about weight loss, diet, and exercise  8. Need for prophylactic vaccination and inoculation against influenza - Flu vaccine greater than or equal to 3yo with preservative IM  9. Tobacco use disorder Smoking cessation- discussed with patient, understands risk of MI, stroke, cancer, and death.    Plan:   During the course of the visit the patient was educated and counseled about appropriate screening and preventive services including:    Pneumococcal vaccine   Influenza vaccine  Td vaccine  Screening electrocardiogram  Screening mammography  Bone densitometry screening  Colorectal cancer screening  Diabetes screening  Glaucoma screening  Nutrition counseling   Advanced directives: given info/requested  Screening recommendations, referrals:  Vaccinations: Please see documentation below and orders this visit.   Nutrition assessed and recommended  Colonoscopy patient declines colonoscopy, long discussion about cologuard, given info and she willl consider.  Mammogram requested Pap smear not  indicated Pelvic exam not indicated Recommended yearly ophthalmology/optometry visit for glaucoma screening and checkup Recommended yearly dental visit for hygiene and checkup Advanced directives - requested  Conditions/risks identified: BMI: Discussed weight loss, diet, and increase physical activity.  Increase physical activity: AHA recommends 150 minutes of physical activity a week.  Medications reviewed DEXA- declined Diabetes is at goal, ACE/ARB therapy: Yes. Urinary Incontinence is not an issue: discussed non pharmacology and pharmacology options.  Fall risk: low- discussed PT, home fall assessment, medications.    Subjective:   Tamara Flores is a 68 y.o. female who presents for Welcome to medicare visit and 3 month follow up on hypertension, prediabetes, hyperlipidemia, vitamin D def.   Her blood pressure has been controlled at home, today their BP is BP: 122/80 mmHg She does not workout. She denies chest pain, shortness of breath, dizziness.  She is on cholesterol medication and denies myalgias. Her cholesterol is not at goal. The cholesterol last visit was:   Lab Results  Component Value Date   CHOL 226* 06/15/2014   HDL 41 06/15/2014   LDLCALC 153* 06/15/2014   TRIG 160* 06/15/2014   CHOLHDL 5.5 06/15/2014   She has been working on diet and exercise for prediabetes, and denies paresthesia of the feet, polydipsia, polyuria and visual disturbances. Last A1C in the office was:  Lab Results  Component Value Date   HGBA1C 6.1* 06/15/2014   Patient is on Vitamin D supplement. Lab Results  Component Value Date   VD25OH 72 06/15/2014      Names of Other Physician/Practitioners you currently use: 1.  [facility]- here for primary care 2. None, eye doctor 3. None dentis Patient Care Team: Unk Pinto, MD as PCP - General (Internal Medicine) Lafayette Dragon, MD as Consulting Physician (Gastroenterology)  Medication Review  Current  Outpatient Prescriptions on File Prior to Visit  Medication Sig Dispense Refill  . B Complex-C (B-COMPLEX WITH VITAMIN C) tablet Take 1 tablet by mouth daily.    . betamethasone, augmented, (DIPROLENE) 0.05 % lotion Apply topically 2 (two) times daily. 30 mL 3  . BYSTOLIC 10 MG tablet TAKE 2 TABLETS EVERY DAY 60 tablet 2  . Cholecalciferol (VITAMIN D) 2000 UNITS tablet Take 6,000 Units by mouth daily.    . diazepam (VALIUM) 2 MG tablet TAKE 1/2 OR 1 TABLET BY MOUTH 3 TIMES A DAY AS NEEDED 90 tablet 1  . olmesartan (BENICAR) 40 MG tablet Take 1 tablet (40 mg total) by mouth daily. 35 tablet 0  . omeprazole (PRILOSEC) 20 MG capsule Take 20 mg by mouth daily.    Marland Kitchen OVER THE COUNTER MEDICATION Calcium with magnesium and zinc    . Pitavastatin Calcium (LIVALO) 2 MG TABS Take 1 tablet (2 mg total) by mouth daily. 28 tablet 0   No current facility-administered medications on file prior to visit.    Current Problems (verified) Patient Active Problem List   Diagnosis Date Noted  . Prediabetes 06/15/2014  . Vitamin D deficiency 06/15/2014  . Medication management 06/15/2014  . Hypertension   . Hyperlipidemia   . Anemia     Screening Tests Health Maintenance  Topic Date Due  . TETANUS/TDAP  10/11/1967  . MAMMOGRAM  10/10/1998  . COLONOSCOPY  10/10/1998  . ZOSTAVAX  10/10/2008  . PNEUMOCOCCAL POLYSACCHARIDE VACCINE AGE 27 AND OVER  10/10/2013  . INFLUENZA VACCINE  06/18/2014     Immunization History  Administered Date(s) Administered  . Influenza Split 09/19/2012, 09/11/2013    Preventative care: Last colonoscopy: Never Last mammogram: 10 years ago Last pap smear/pelvic exam: remote DEXA:declines  Prior vaccinations: TD or Tdap: declines  Influenza: wants regular Prevnar 13: DUE, will consider  Pneumococcal: declines Shingles/Zostavax: declines  History reviewed: allergies, current medications, past family history, past medical history, past social history, past surgical  history and problem list  Risk Factors: Osteoporosis/FallRisk: postmenopausal estrogen deficiency, dietary calcium and/or vitamin D deficiency and smoker In the past year have you fallen or had a near fall?:No History of fracture in the past year: no  Tobacco History  Substance Use Topics  . Smoking status: Current Every Day Smoker -- 0.33 packs/day    Types: Cigarettes  . Smokeless tobacco: Not on file  . Alcohol Use: No   She does smoke.  Patient is not a former smoker. Are there smokers in your home (other than you)?  Yes  Alcohol Current alcohol use: none  Caffeine Current caffeine use: coffee 1 /day  Exercise Current exercise: none  Nutrition/Diet Current diet: in general, a "healthy" diet    Cardiac risk factors: advanced age (older than 40 for men, 97 for women), dyslipidemia, hypertension, obesity (BMI >= 30 kg/m2), sedentary lifestyle and smoking/ tobacco exposure.  Depression Screen (Note: if answer to either of the following is "Yes", a more complete depression screening is indicated)   Q1: Over the past two weeks, have you felt down, depressed or hopeless? No  Q2: Over the past two weeks, have you felt little interest or pleasure in doing things? No  Have you lost interest or pleasure in daily life? No  Do you often feel hopeless? No  Do you cry easily over simple problems? No  Activities of Daily Living In your present state of health, do you have any difficulty performing the following activities?:  Driving?  No Managing money?  No Feeding yourself? No Getting from bed to chair? No Climbing a flight of stairs? No Preparing food and eating?: No Bathing or showering? No Getting dressed: No Getting to the toilet? No Using the toilet:No Moving around from place to place: No   Are you sexually active?  No  Do you have more than one partner?  No  Vision Difficulties: No  Hearing Difficulties: No Do you often ask people to speak up or repeat  themselves? No Do you experience ringing or noises in your ears? Yes Do you have difficulty understanding soft or whispered voices? No  Cognition  Do you feel that you have a problem with memory?No  Do you often misplace items? No  Do you feel safe at home?  Yes  Advanced directives Does patient have a [place]? No Does patient have a Living Will? No   Objective:   Blood pressure 122/80, pulse 76, temperature 97.9 F (36.6 C), resp. rate 16, height 5' 5.5" (1.664 m), weight 200 lb (90.719 kg). Body mass index is 32.76 kg/(m^2).  General appearance: alert, no distress, WD/WN,  female Cognitive Testing  Alert? Yes  Normal Appearance?Yes  Oriented to person? Yes  Place? Yes   Time? Yes  Recall of three objects?  Yes  Can perform simple calculations? Yes  Displays appropriate judgment?Yes  Can read the correct time from a watch face?Yes  HEENT: normocephalic, sclerae anicteric, TMs pearly, nares patent, no discharge or erythema, pharynx normal Oral cavity: MMM, no lesions Neck: supple, no lymphadenopathy, no thyromegaly, no masses Heart: RRR, normal S1, S2, no murmurs Lungs: CTA bilaterally, no wheezes, rhonchi, or rales Abdomen: +bs, soft, non tender, non distended, no masses, no hepatomegaly, no splenomegaly Musculoskeletal: nontender, no swelling, no obvious deformity Extremities: no edema, no cyanosis, no clubbing Pulses: 2+ symmetric, upper and lower extremities, normal cap refill Neurological: alert, oriented x 3, CN2-12 intact, strength normal upper extremities and lower extremities, sensation normal throughout, DTRs 2+ throughout, no cerebellar signs, gait normal Psychiatric: normal affect, behavior normal, pleasant  Breast: defer Gyn: defer Rectal: defer  Medicare Attestation I have personally reviewed: The patient's medical and social history Their use of alcohol, tobacco or illicit drugs Their current medications and supplements The  patient's functional ability including ADLs,fall risks, home safety risks, cognitive, and hearing and visual impairment Diet and physical activities Evidence for depression or mood disorders  The patient's weight, height, BMI, and visual acuity have been recorded in the chart.  I have made referrals, counseling, and provided education to the patient based on review of the above and I have provided the patient with a written personalized care plan for preventive services.     Vicie Mutters, PA-C   09/26/2014

## 2014-09-27 LAB — LIPID PANEL
CHOL/HDL RATIO: 4.3 ratio
Cholesterol: 181 mg/dL (ref 0–200)
HDL: 42 mg/dL (ref >39)
LDL Cholesterol: 102 mg/dL — ABNORMAL HIGH (ref 0–99)
Triglycerides: 186 mg/dL — ABNORMAL HIGH (ref <150)
VLDL: 37 mg/dL (ref 0–40)

## 2014-09-27 LAB — HEPATIC FUNCTION PANEL
ALBUMIN: 4.2 g/dL (ref 3.5–5.2)
ALT: 11 U/L (ref 0–35)
AST: 13 U/L (ref 0–37)
Alkaline Phosphatase: 108 U/L (ref 39–117)
Bilirubin, Direct: 0.1 mg/dL (ref 0.0–0.3)
Indirect Bilirubin: 0.3 mg/dL (ref 0.2–1.2)
Total Bilirubin: 0.4 mg/dL (ref 0.2–1.2)
Total Protein: 6.7 g/dL (ref 6.0–8.3)

## 2014-09-27 LAB — INSULIN, FASTING: INSULIN FASTING, SERUM: 14.7 u[IU]/mL (ref 2.0–19.6)

## 2014-09-27 LAB — HEMOGLOBIN A1C
HEMOGLOBIN A1C: 5.9 % — AB (ref <5.7)
Mean Plasma Glucose: 123 mg/dL — ABNORMAL HIGH (ref <117)

## 2014-09-27 LAB — BASIC METABOLIC PANEL WITH GFR
BUN: 9 mg/dL (ref 6–23)
CALCIUM: 9.6 mg/dL (ref 8.4–10.5)
CO2: 26 mEq/L (ref 19–32)
CREATININE: 0.81 mg/dL (ref 0.50–1.10)
Chloride: 102 mEq/L (ref 96–112)
GFR, EST AFRICAN AMERICAN: 88 mL/min
GFR, Est Non African American: 76 mL/min
GLUCOSE: 96 mg/dL (ref 70–99)
Potassium: 4.3 mEq/L (ref 3.5–5.3)
Sodium: 142 mEq/L (ref 135–145)

## 2014-09-27 LAB — TSH: TSH: 0.838 u[IU]/mL (ref 0.350–4.500)

## 2014-09-27 LAB — MAGNESIUM: Magnesium: 1.5 mg/dL (ref 1.5–2.5)

## 2014-11-12 ENCOUNTER — Encounter: Payer: Self-pay | Admitting: *Deleted

## 2014-12-01 ENCOUNTER — Other Ambulatory Visit: Payer: Self-pay | Admitting: Internal Medicine

## 2014-12-31 ENCOUNTER — Other Ambulatory Visit: Payer: Self-pay | Admitting: Internal Medicine

## 2015-03-05 DIAGNOSIS — J189 Pneumonia, unspecified organism: Secondary | ICD-10-CM | POA: Diagnosis not present

## 2015-03-06 ENCOUNTER — Ambulatory Visit (INDEPENDENT_AMBULATORY_CARE_PROVIDER_SITE_OTHER): Payer: Medicare Other | Admitting: Physician Assistant

## 2015-03-06 ENCOUNTER — Encounter: Payer: Self-pay | Admitting: Physician Assistant

## 2015-03-06 VITALS — BP 144/86 | HR 88 | Temp 98.2°F | Resp 18 | Ht 65.5 in | Wt 198.0 lb

## 2015-03-06 DIAGNOSIS — I10 Essential (primary) hypertension: Secondary | ICD-10-CM | POA: Diagnosis not present

## 2015-03-06 DIAGNOSIS — E559 Vitamin D deficiency, unspecified: Secondary | ICD-10-CM | POA: Diagnosis not present

## 2015-03-06 DIAGNOSIS — R7309 Other abnormal glucose: Secondary | ICD-10-CM

## 2015-03-06 DIAGNOSIS — E785 Hyperlipidemia, unspecified: Secondary | ICD-10-CM

## 2015-03-06 DIAGNOSIS — E669 Obesity, unspecified: Secondary | ICD-10-CM

## 2015-03-06 DIAGNOSIS — D649 Anemia, unspecified: Secondary | ICD-10-CM

## 2015-03-06 DIAGNOSIS — F17200 Nicotine dependence, unspecified, uncomplicated: Secondary | ICD-10-CM

## 2015-03-06 DIAGNOSIS — Z79899 Other long term (current) drug therapy: Secondary | ICD-10-CM | POA: Diagnosis not present

## 2015-03-06 DIAGNOSIS — R059 Cough, unspecified: Secondary | ICD-10-CM

## 2015-03-06 DIAGNOSIS — R05 Cough: Secondary | ICD-10-CM

## 2015-03-06 DIAGNOSIS — R7303 Prediabetes: Secondary | ICD-10-CM

## 2015-03-06 LAB — BASIC METABOLIC PANEL WITH GFR
BUN: 11 mg/dL (ref 6–23)
CO2: 26 meq/L (ref 19–32)
CREATININE: 0.74 mg/dL (ref 0.50–1.10)
Calcium: 9.6 mg/dL (ref 8.4–10.5)
Chloride: 102 mEq/L (ref 96–112)
GFR, Est African American: >89 mL/min
GFR, Est Non African American: 85 mL/min
GLUCOSE: 102 mg/dL — AB (ref 70–99)
Potassium: 4.3 mEq/L (ref 3.5–5.3)
Sodium: 139 mEq/L (ref 135–145)

## 2015-03-06 LAB — CBC WITH DIFFERENTIAL/PLATELET
BASOS ABS: 0 10*3/uL (ref 0.0–0.1)
BASOS PCT: 0 % (ref 0–1)
Eosinophils Absolute: 0 10*3/uL (ref 0.0–0.7)
Eosinophils Relative: 0 % (ref 0–5)
HCT: 45.6 % (ref 36.0–46.0)
Hemoglobin: 15.4 g/dL — ABNORMAL HIGH (ref 12.0–15.0)
Lymphocytes Relative: 14 % (ref 12–46)
Lymphs Abs: 2.2 10*3/uL (ref 0.7–4.0)
MCH: 29.7 pg (ref 26.0–34.0)
MCHC: 33.8 g/dL (ref 30.0–36.0)
MCV: 88 fL (ref 78.0–100.0)
MONO ABS: 0.3 10*3/uL (ref 0.1–1.0)
MPV: 10.8 fL (ref 8.6–12.4)
Monocytes Relative: 2 % — ABNORMAL LOW (ref 3–12)
NEUTROS ABS: 13.1 10*3/uL — AB (ref 1.7–7.7)
NEUTROS PCT: 84 % — AB (ref 43–77)
PLATELETS: 293 10*3/uL (ref 150–400)
RBC: 5.18 MIL/uL — ABNORMAL HIGH (ref 3.87–5.11)
RDW: 13.6 % (ref 11.5–15.5)
WBC: 15.6 10*3/uL — AB (ref 4.0–10.5)

## 2015-03-06 LAB — HEPATIC FUNCTION PANEL
ALT: 11 U/L (ref 0–35)
AST: 10 U/L (ref 0–37)
Albumin: 4.3 g/dL (ref 3.5–5.2)
Alkaline Phosphatase: 106 U/L (ref 39–117)
BILIRUBIN DIRECT: 0.1 mg/dL (ref 0.0–0.3)
BILIRUBIN INDIRECT: 0.3 mg/dL (ref 0.2–1.2)
Total Bilirubin: 0.4 mg/dL (ref 0.2–1.2)
Total Protein: 7 g/dL (ref 6.0–8.3)

## 2015-03-06 LAB — LIPID PANEL
Cholesterol: 238 mg/dL — ABNORMAL HIGH (ref 0–200)
HDL: 46 mg/dL (ref >=46)
LDL Cholesterol: 167 mg/dL — ABNORMAL HIGH (ref 0–99)
Total CHOL/HDL Ratio: 5.2 Ratio
Triglycerides: 123 mg/dL (ref <150)
VLDL: 25 mg/dL (ref 0–40)

## 2015-03-06 LAB — TSH: TSH: 0.558 u[IU]/mL (ref 0.350–4.500)

## 2015-03-06 LAB — MAGNESIUM: Magnesium: 1.4 mg/dL — ABNORMAL LOW (ref 1.5–2.5)

## 2015-03-06 MED ORDER — FLUTICASONE FUROATE-VILANTEROL 100-25 MCG/INH IN AEPB: 1.0000 | INHALATION_SPRAY | Freq: Every day | RESPIRATORY_TRACT | Status: DC

## 2015-03-06 MED ORDER — ALBUTEROL SULFATE 108 (90 BASE) MCG/ACT IN AEPB: 2.0000 | INHALATION_SPRAY | Freq: Four times a day (QID) | RESPIRATORY_TRACT | Status: DC | PRN

## 2015-03-06 MED ORDER — DOXYCYCLINE HYCLATE 100 MG PO CAPS: ORAL_CAPSULE | ORAL | Status: DC

## 2015-03-06 NOTE — Patient Instructions (Addendum)
Stop the prendisone Start on dulera. Samples- this will be a once a day inhaler to help with your lungs The albuterol is an AS NEEDED inhaler, if you are wheezing or short of breath  Continue the flonase/nasonex.     Cholesterol Cholesterol is a white, waxy, fat-like substance needed by your body in small amounts. The liver makes all the cholesterol you need. Cholesterol is carried from the liver by the blood through the blood vessels. Deposits of cholesterol (plaque) may build up on blood vessel walls. These make the arteries narrower and stiffer. Cholesterol plaques increase the risk for heart attack and stroke.  You cannot feel your cholesterol level even if it is very high. The only way to know it is high is with a blood test. Once you know your cholesterol levels, you should keep a record of the test results. Work with your health care provider to keep your levels in the desired range.  WHAT DO THE RESULTS MEAN?  Total cholesterol is a rough measure of all the cholesterol in your blood.   LDL is the so-called bad cholesterol. This is the type that deposits cholesterol in the walls of the arteries. You want this level to be low.   HDL is the good cholesterol because it cleans the arteries and carries the LDL away. You want this level to be high.  Triglycerides are fat that the body can either burn for energy or store. High levels are closely linked to heart disease.  WHAT ARE THE DESIRED LEVELS OF CHOLESTEROL?  Total cholesterol below 200.   LDL below 100 for people at risk, below 70 for those at very high risk.   HDL above 50 is good, above 60 is best.   Triglycerides below 150.  HOW CAN I LOWER MY CHOLESTEROL?  Diet. Follow your diet programs as directed by your health care provider.   Choose fish or white meat chicken and [place], roasted or baked. Limit fatty cuts of red meat, fried foods, and processed meats, such as sausage and lunch meats.   Eat lots of fresh  fruits and vegetables.  Choose whole grains, beans, pasta, potatoes, and cereals.   Use only small amounts of olive, corn, or canola oils.   Avoid butter, mayonnaise, shortening, or palm kernel oils.  Avoid foods with trans fats.   Drink skim or nonfat milk and eat low-fat or nonfat yogurt and cheeses. Avoid whole milk, cream, ice cream, egg yolks, and full-fat cheeses.   Healthy desserts include angel food cake, ginger snaps, animal crackers, hard candy, popsicles, and low-fat or nonfat frozen yogurt. Avoid pastries, cakes, pies, and cookies.   Exercise. Follow your exercise programs as directed by your health care provider.   A regular program helps decrease LDL and raise HDL.   A regular program helps with weight control.   Do things that increase your activity level like gardening, walking, or taking the stairs. Ask your health care provider about how you can be more active in your daily life.   Medicine. Take medicine only as directed by your health care provider.   Medicine may be prescribed by your health care provider to help lower cholesterol and decrease the risk for heart disease.   If you have several risk factors, you may need medicine even if your levels are normal. Document Released: 07/30/2001 Document Revised: 03/21/2014 Document Reviewed: 08/18/2013 [facility] Patient Information 2015 [place], [place]. This information is not intended to replace advice given to you by your health  care provider. Make sure you discuss any questions you have with your health care provider.

## 2015-03-06 NOTE — Progress Notes (Signed)
Assessment and Plan:  1. Hypertension -Continue medication, monitor blood pressure at home. Continue DASH diet.  Reminder to go to the ER if any CP, SOB, nausea, dizziness, severe HA, changes vision/speech, left arm numbness and tingling and jaw pain.  2. Cholesterol -Continue diet and exercise. Check cholesterol.   3. Prediabetes  -Continue diet and exercise. Check A1C  4. Vitamin D Def - check level and continue medications.   5. ? Pneumonia/sinus infection Continue the doxycycyline and will do a 10 day and prednisone, will refill flonase.  Advised to stop smoking Will do the respiclick and may start on inhaler for COPD given dulera samples.   Continue diet and meds as discussed. Further disposition pending results of labs. Over 30 minutes of exam, counseling, chart review, and critical decision making was performed  HPI 69 y.o. female  presents for 3 month follow up on hypertension, cholesterol, prediabetes, and vitamin D deficiency, inaddition she went to the UC for pneumonia.   Her blood pressure has been controlled at home, she is on benicar, today their BP is BP: (!) 144/86 mmHg  She does not workout. She denies chest pain, shortness of breath, dizziness.  She is on cholesterol medication, livalo due to intolerances to other statins but this was too expensive, she was suppose to start on crestor but the samples were expired and denies myalgias. Her cholesterol is not at goal. The cholesterol last visit was:   Lab Results  Component Value Date   CHOL 181 09/26/2014   HDL 42 09/26/2014   LDLCALC 102* 09/26/2014   TRIG 186* 09/26/2014   CHOLHDL 4.3 09/26/2014   She has been working on diet and exercise for prediabetes, and denies paresthesia of the feet, polydipsia, polyuria and visual disturbances. Last A1C in the office was:  Lab Results  Component Value Date   HGBA1C 5.9* 09/26/2014  Patient is on Vitamin D supplement.   Lab Results  Component Value Date   VD25OH 81  06/15/2014  [facility] cough, sinus pain, sinus congestion for 2 weeks with clear mucus. She has been taking mucinex, zyrtec, and flonase. She states she does the flonase all of her symptoms get better. She was given doxy and prednisone.     Current Medications:  Current Outpatient Prescriptions on File Prior to Visit  Medication Sig Dispense Refill  . BYSTOLIC 10 MG tablet TAKE 2 TABLETS EVERY DAY 60 tablet 2  . diazepam (VALIUM) 2 MG tablet TAKE 1/2 TO 1 TABLET 3 TIMES A DAY AS NEEDED 90 tablet 0  . olmesartan (BENICAR) 40 MG tablet Take 1 tablet (40 mg total) by mouth daily. 35 tablet 0  . omeprazole (PRILOSEC) 20 MG capsule Take 20 mg by mouth daily.    . Cholecalciferol (VITAMIN D) 2000 UNITS tablet Take 6,000 Units by mouth daily.    Marland Kitchen OVER THE COUNTER MEDICATION Calcium with magnesium and zinc    . Pitavastatin Calcium (LIVALO) 2 MG TABS Take 1 tablet (2 mg total) by mouth daily. (Patient not taking: Reported on 03/06/2015) 28 tablet 0   No current facility-administered medications on file prior to visit.   Medical History:  Past Medical History  Diagnosis Date  . Hypertension   . Hyperlipidemia   . Anemia    Allergies:  Allergies  Allergen Reactions  . Azithromycin     GI upset  . Crestor [Rosuvastatin]     GI upset  . Klonopin [Clonazepam]   . Lexapro [  Escitalopram Oxalate]   . Lisinopril   . Lopid [Gemfibrozil]   . Losartan   . Magnesium-Containing Compounds     GI upset  . Prednisone   . Sulfa Antibiotics   . Vytorin [Ezetimibe-Simvastatin]   . Zoloft [Sertraline Hcl]      Review of Systems:  Review of Systems  Constitutional: Negative.   HENT: Positive for congestion. Negative for ear discharge, ear pain, hearing loss, nosebleeds, sore throat and tinnitus.   Eyes: Negative.   Respiratory: Positive for cough, sputum production and shortness of breath. Negative for hemoptysis, wheezing and stridor.   Cardiovascular: Negative.    Gastrointestinal: Negative.   Genitourinary: Negative.   Musculoskeletal: Negative.   Skin: Negative.   Neurological: Negative.  Negative for headaches.  Endo/Heme/Allergies: Negative.   Psychiatric/Behavioral: Negative.     Family history- Review and unchanged Social history- Review and unchanged Physical Exam: BP 144/86 mmHg  Pulse 88  Temp(Src) 98.2 F (36.8 C) (Temporal)  Resp 18  Ht 5' 5.5" (1.664 m)  Wt 198 lb (89.812 kg)  BMI 32.44 kg/m2  SpO2 94% Wt Readings from Last 3 Encounters:  03/06/15 198 lb (89.812 kg)  09/26/14 200 lb (90.719 kg)  06/15/14 204 lb (92.534 kg)   General Appearance: Well nourished, in no apparent distress. Eyes: PERRLA, EOMs, conjunctiva no swelling or erythema Sinuses: No Frontal/maxillary tenderness ENT/Mouth: Ext aud canals clear, TMs without erythema, bulging. No erythema, swelling, or exudate on post pharynx.  Tonsils not swollen or erythematous. Hearing normal.  Neck: Supple, thyroid normal.  Respiratory: Respiratory effort normal, BS equal bilaterally without rales, rhonchi, wheezing or stridor.  Cardio: RRR with no MRGs. Brisk peripheral pulses without edema.  Abdomen: Soft, + BS,  Non tender, no guarding, rebound, hernias, masses. Lymphatics: Non tender without lymphadenopathy.  Musculoskeletal: Full ROM, 5/5 strength, Normal gait Skin: Warm, dry without rashes, lesions, ecchymosis.  Neuro: Cranial nerves intact. Normal muscle tone, no cerebellar symptoms. Psych: Awake and oriented X 3, normal affect, Insight and Judgment appropriate.    Vicie Mutters, PA-C 2:32 PM [facility]

## 2015-03-07 LAB — HEMOGLOBIN A1C
Hgb A1c MFr Bld: 5.8 % — ABNORMAL HIGH (ref <5.7)
Mean Plasma Glucose: 120 mg/dL — ABNORMAL HIGH (ref <117)

## 2015-03-30 ENCOUNTER — Encounter: Payer: Self-pay | Admitting: Internal Medicine

## 2015-03-30 ENCOUNTER — Ambulatory Visit (INDEPENDENT_AMBULATORY_CARE_PROVIDER_SITE_OTHER): Payer: Medicare Other | Admitting: Internal Medicine

## 2015-03-30 VITALS — BP 128/86 | HR 78 | Temp 98.2°F | Resp 18 | Ht 65.5 in | Wt 204.0 lb

## 2015-03-30 DIAGNOSIS — R300 Dysuria: Secondary | ICD-10-CM

## 2015-03-30 DIAGNOSIS — R609 Edema, unspecified: Secondary | ICD-10-CM | POA: Diagnosis not present

## 2015-03-30 DIAGNOSIS — L02219 Cutaneous abscess of trunk, unspecified: Secondary | ICD-10-CM | POA: Diagnosis not present

## 2015-03-30 MED ORDER — CEPHALEXIN 500 MG PO CAPS: 500.0000 mg | ORAL_CAPSULE | Freq: Four times a day (QID) | ORAL | Status: AC

## 2015-03-30 MED ORDER — HYDROCHLOROTHIAZIDE 12.5 MG PO TABS: ORAL_TABLET | ORAL | Status: DC

## 2015-03-30 MED ORDER — MUPIROCIN CALCIUM 2 % EX CREA: TOPICAL_CREAM | CUTANEOUS | Status: AC

## 2015-03-30 MED ORDER — FLUCONAZOLE 150 MG PO TABS: 150.0000 mg | ORAL_TABLET | Freq: Once | ORAL | Status: DC

## 2015-03-30 NOTE — Progress Notes (Signed)
Subjective:    Patient ID: Tamara Flores, female    DOB: 1946/01/30, 69 y.o.   MRN: [id number]  Abdominal Pain Associated symptoms include dysuria and frequency. Pertinent negatives include no constipation, diarrhea, fever, hematuria, nausea or vomiting.  Vaginal Pain The patient's pertinent negatives include no vaginal discharge. Associated symptoms include abdominal pain, dysuria, frequency and urgency. Pertinent negatives include no chills, constipation, diarrhea, fever, hematuria, nausea or vomiting.   Patient presents to the office for evaluation of abdominal pain, back pain, dysuria, and bilateral ankle swelling.  She reports that she recently finished a week long course of doxycycline which she feels has caused her to have lower abdominal pain, dysuria when she is urinating, and some pain in her back on the right side.  She reports that she has tried taking azo which helps a little bit.  She reports that the urinary symptoms have been going on for 1 week.  She reports that she has had some urinary frequency and urgency.  She has not have any vaginal discharge or vaginal bleeding.  She has not felt a rash.  She is not sexually active.    She also reports 1 week of ankle swelling.  Patient used to be on fluid pills but was taken off them due to kidney function.  She also reports that she has an abscess which started a 10 days ago which started after she wore some clothing which rubbed against it.  It opened up after using warm compressed and now it is draining.     Review of Systems  Constitutional: Negative for fever, chills and fatigue.  Gastrointestinal: Positive for abdominal pain. Negative for nausea, vomiting, diarrhea, constipation, blood in stool and anal bleeding.  Genitourinary: Positive for dysuria, urgency and frequency. Negative for hematuria, vaginal bleeding, vaginal discharge and vaginal pain.  Skin: Positive for wound. Negative for color change.  Neurological: Negative for  dizziness and light-headedness.       Objective:   Physical Exam  Constitutional: She is oriented to person, place, and time. She appears well-developed and well-nourished. No distress.  HENT:  Head: Normocephalic and atraumatic.  Mouth/Throat: Oropharynx is clear and moist. No oropharyngeal exudate.  Eyes: Conjunctivae and EOM are normal. Pupils are equal, round, and reactive to light. No scleral icterus.  Neck: Normal range of motion. Neck supple. No JVD present. No thyromegaly present.  Cardiovascular: Normal rate, regular rhythm, normal heart sounds and intact distal pulses.  Exam reveals no gallop and no friction rub.   No murmur heard. Pulmonary/Chest: Effort normal and breath sounds normal. No respiratory distress. She has no wheezes. She has no rales. She exhibits no tenderness.  Abdominal: Soft. Normal appearance and bowel sounds are normal. She exhibits no distension and no mass. There is no tenderness. There is no rigidity, no rebound, no guarding, no CVA tenderness, no tenderness at McBurney's point and negative Murphy's sign.  Musculoskeletal: Normal range of motion.  Lymphadenopathy:    She has no cervical adenopathy.  Neurological: She is alert and oriented to person, place, and time.  Skin: Skin is warm and dry. She is not diaphoretic.     Psychiatric: She has a normal mood and affect. Her behavior is normal. Judgment and thought content normal.  Nursing note and vitals reviewed.  Filed Vitals:   03/30/15 1114  BP: 128/86  Pulse: 78  Temp: 98.2 F (36.8 C)  Resp: 18          Assessment & Plan:  1. Dysuria -keflex -cont azo prn -diflucan as needed for yeast after keflex - Urinalysis, Routine w reflex microscopic - Culture, Urine  2. Trunk abscess -opened and draining per patient -cont keflex -bactroban given at patients insistence -cont warm compresses -return for worsening redness and systemic illness symptoms which we discussed  3. Peripheral  edema -HCTZ 12.5 mg prn for edema -elevate legs -compression socks

## 2015-03-30 NOTE — Patient Instructions (Signed)

## 2015-03-31 LAB — URINALYSIS, ROUTINE W REFLEX MICROSCOPIC
Bilirubin Urine: NEGATIVE
Glucose, UA: NEGATIVE mg/dL
Hgb urine dipstick: NEGATIVE
Ketones, ur: NEGATIVE mg/dL
LEUKOCYTES UA: NEGATIVE
NITRITE: NEGATIVE
PROTEIN: NEGATIVE mg/dL
Specific Gravity, Urine: 1.011 (ref 1.005–1.030)
Urobilinogen, UA: 0.2 mg/dL (ref 0.0–1.0)
pH: 6 (ref 5.0–8.0)

## 2015-03-31 LAB — URINE CULTURE: Colony Count: 30000

## 2015-04-08 ENCOUNTER — Other Ambulatory Visit: Payer: Self-pay | Admitting: Internal Medicine

## 2015-04-11 ENCOUNTER — Other Ambulatory Visit: Payer: Self-pay | Admitting: Internal Medicine

## 2015-04-11 DIAGNOSIS — F4323 Adjustment disorder with mixed anxiety and depressed mood: Secondary | ICD-10-CM

## 2015-06-22 ENCOUNTER — Ambulatory Visit: Payer: Self-pay | Admitting: Internal Medicine

## 2015-07-18 ENCOUNTER — Ambulatory Visit: Payer: Self-pay | Admitting: Internal Medicine

## 2015-07-26 ENCOUNTER — Ambulatory Visit: Payer: Self-pay | Admitting: Internal Medicine

## 2015-08-17 ENCOUNTER — Ambulatory Visit: Payer: Medicare Other | Admitting: Internal Medicine

## 2015-08-22 ENCOUNTER — Ambulatory Visit (INDEPENDENT_AMBULATORY_CARE_PROVIDER_SITE_OTHER): Payer: Medicare Other | Admitting: Physician Assistant

## 2015-08-22 ENCOUNTER — Encounter: Payer: Self-pay | Admitting: Physician Assistant

## 2015-08-22 VITALS — BP 140/80 | HR 92 | Temp 97.5°F | Resp 14 | Ht 65.5 in | Wt 195.0 lb

## 2015-08-22 DIAGNOSIS — Z79899 Other long term (current) drug therapy: Secondary | ICD-10-CM

## 2015-08-22 DIAGNOSIS — E8881 Metabolic syndrome: Secondary | ICD-10-CM | POA: Diagnosis not present

## 2015-08-22 DIAGNOSIS — I10 Essential (primary) hypertension: Secondary | ICD-10-CM

## 2015-08-22 DIAGNOSIS — Z6831 Body mass index (BMI) 31.0-31.9, adult: Secondary | ICD-10-CM | POA: Diagnosis not present

## 2015-08-22 DIAGNOSIS — R7309 Other abnormal glucose: Secondary | ICD-10-CM | POA: Diagnosis not present

## 2015-08-22 DIAGNOSIS — Z1389 Encounter for screening for other disorder: Secondary | ICD-10-CM | POA: Diagnosis not present

## 2015-08-22 DIAGNOSIS — E785 Hyperlipidemia, unspecified: Secondary | ICD-10-CM | POA: Diagnosis not present

## 2015-08-22 DIAGNOSIS — E669 Obesity, unspecified: Secondary | ICD-10-CM | POA: Diagnosis not present

## 2015-08-22 DIAGNOSIS — R7303 Prediabetes: Secondary | ICD-10-CM

## 2015-08-22 DIAGNOSIS — Z23 Encounter for immunization: Secondary | ICD-10-CM | POA: Diagnosis not present

## 2015-08-22 DIAGNOSIS — Z0000 Encounter for general adult medical examination without abnormal findings: Secondary | ICD-10-CM | POA: Insufficient documentation

## 2015-08-22 DIAGNOSIS — Z1331 Encounter for screening for depression: Secondary | ICD-10-CM

## 2015-08-22 DIAGNOSIS — F17200 Nicotine dependence, unspecified, uncomplicated: Secondary | ICD-10-CM

## 2015-08-22 DIAGNOSIS — E559 Vitamin D deficiency, unspecified: Secondary | ICD-10-CM

## 2015-08-22 LAB — CBC WITH DIFFERENTIAL/PLATELET
Basophils Absolute: 0 10*3/uL (ref 0.0–0.1)
Basophils Relative: 0 % (ref 0–1)
EOS PCT: 1 % (ref 0–5)
Eosinophils Absolute: 0.1 10*3/uL (ref 0.0–0.7)
HEMATOCRIT: 41.8 % (ref 36.0–46.0)
HEMOGLOBIN: 14.6 g/dL (ref 12.0–15.0)
LYMPHS ABS: 3 10*3/uL (ref 0.7–4.0)
LYMPHS PCT: 29 % (ref 12–46)
MCH: 29.5 pg (ref 26.0–34.0)
MCHC: 34.9 g/dL (ref 30.0–36.0)
MCV: 84.4 fL (ref 78.0–100.0)
MONOS PCT: 5 % (ref 3–12)
MPV: 10.6 fL (ref 8.6–12.4)
Monocytes Absolute: 0.5 10*3/uL (ref 0.1–1.0)
NEUTROS ABS: 6.8 10*3/uL (ref 1.7–7.7)
Neutrophils Relative %: 65 % (ref 43–77)
Platelets: 249 10*3/uL (ref 150–400)
RBC: 4.95 MIL/uL (ref 3.87–5.11)
RDW: 15.2 % (ref 11.5–15.5)
WBC: 10.5 10*3/uL (ref 4.0–10.5)

## 2015-08-22 LAB — LIPID PANEL
CHOLESTEROL: 242 mg/dL — AB (ref 125–200)
HDL: 36 mg/dL — ABNORMAL LOW (ref >=46)
LDL Cholesterol: 168 mg/dL — ABNORMAL HIGH (ref <130)
TRIGLYCERIDES: 188 mg/dL — AB (ref <150)
Total CHOL/HDL Ratio: 6.7 Ratio — ABNORMAL HIGH (ref <=5.0)
VLDL: 38 mg/dL — AB (ref <30)

## 2015-08-22 LAB — BASIC METABOLIC PANEL WITH GFR
BUN: 12 mg/dL (ref 7–25)
CALCIUM: 8.7 mg/dL (ref 8.6–10.4)
CO2: 28 mmol/L (ref 20–31)
Chloride: 105 mmol/L (ref 98–110)
Creat: 0.83 mg/dL (ref 0.50–0.99)
GFR, EST NON AFRICAN AMERICAN: 74 mL/min (ref >=60)
GFR, Est African American: 85 mL/min (ref >=60)
GLUCOSE: 90 mg/dL (ref 65–99)
Potassium: 3.7 mmol/L (ref 3.5–5.3)
Sodium: 140 mmol/L (ref 135–146)

## 2015-08-22 LAB — HEPATIC FUNCTION PANEL
ALK PHOS: 102 U/L (ref 33–130)
ALT: 11 U/L (ref 6–29)
AST: 11 U/L (ref 10–35)
Albumin: 3.8 g/dL (ref 3.6–5.1)
Bilirubin, Direct: 0.1 mg/dL (ref <=0.2)
Indirect Bilirubin: 0.3 mg/dL (ref 0.2–1.2)
TOTAL PROTEIN: 6.3 g/dL (ref 6.1–8.1)
Total Bilirubin: 0.4 mg/dL (ref 0.2–1.2)

## 2015-08-22 LAB — TSH: TSH: 0.832 u[IU]/mL (ref 0.350–4.500)

## 2015-08-22 LAB — MAGNESIUM: MAGNESIUM: 1.3 mg/dL — AB (ref 1.5–2.5)

## 2015-08-22 MED ORDER — ROSUVASTATIN CALCIUM 20 MG PO TABS: 20.0000 mg | ORAL_TABLET | Freq: Every day | ORAL | Status: DC

## 2015-08-22 NOTE — Patient Instructions (Addendum)
Check cost of medicaitons with [facility] tetter If you want to try different blood pressure medications to decrease cost, then let me know.   Will send in crestor, take 1/2 3-4 days a week.   Monitor your blood pressure at home, if it is above 140/90 consistently call the office so we can adjust your medications. Go to the ER if any CP, SOB, nausea, dizziness, severe HA, changes vision/speech  DASH Eating Plan DASH stands for "Dietary Approaches to Stop Hypertension." The DASH eating plan is a healthy eating plan that has been shown to reduce high blood pressure (hypertension). Additional health benefits may include reducing the risk of type 2 diabetes mellitus, heart disease, and stroke. The DASH eating plan may also help with weight loss. WHAT DO I NEED TO KNOW ABOUT THE DASH EATING PLAN? For the DASH eating plan, you will follow these general guidelines:  Choose foods with a percent daily value for sodium of less than 5% (as listed on the food label).  Use salt-free seasonings or herbs instead of table salt or sea salt.  Check with your health care provider or pharmacist before using salt substitutes.  Eat lower-sodium products, often labeled as "lower sodium" or "no salt added."  Eat fresh foods.  Eat more vegetables, fruits, and low-fat dairy products.  Choose whole grains. Look for the word "whole" as the first word in the ingredient list.  Choose fish and skinless chicken or [place] more often than red meat. Limit fish, poultry, and meat to 6 oz (170 g) each day.  Limit sweets, desserts, sugars, and sugary drinks.  Choose heart-healthy fats.  Limit cheese to 1 oz (28 g) per day.  Eat more home-cooked food and less restaurant, buffet, and fast food.  Limit fried foods.  Cook foods using methods other than frying.  Limit canned vegetables. If you do use them, rinse them well to decrease the sodium.  When eating at a restaurant, ask that your food be prepared with less salt,  or no salt if possible. WHAT FOODS CAN I EAT? Seek help from a dietitian for individual calorie needs. Grains Whole grain or whole wheat bread. Brown rice. Whole grain or whole wheat pasta. Quinoa, bulgur, and whole grain cereals. Low-sodium cereals. Corn or whole wheat flour tortillas. Whole grain cornbread. Whole grain crackers. Low-sodium crackers. Vegetables Fresh or frozen vegetables (raw, steamed, roasted, or grilled). Low-sodium or reduced-sodium tomato and vegetable juices. Low-sodium or reduced-sodium tomato sauce and paste. Low-sodium or reduced-sodium canned vegetables.  Fruits All fresh, canned (in natural juice), or frozen fruits. Meat and Other Protein Products Ground beef (85% or leaner), grass-fed beef, or beef trimmed of fat. Skinless chicken or [place]. Ground chicken or [place]. Pork trimmed of fat. All fish and seafood. Eggs. Dried beans, peas, or lentils. Unsalted nuts and seeds. Unsalted canned beans. Dairy Low-fat dairy products, such as skim or 1% milk, 2% or reduced-fat cheeses, low-fat ricotta or cottage cheese, or plain low-fat yogurt. Low-sodium or reduced-sodium cheeses. Fats and Oils Tub margarines without trans fats. Light or reduced-fat mayonnaise and salad dressings (reduced sodium). Avocado. Safflower, olive, or canola oils. Natural peanut or almond butter. Other Unsalted popcorn and pretzels. The items listed above may not be a complete list of recommended foods or beverages. Contact your dietitian for more options. WHAT FOODS ARE NOT RECOMMENDED? Grains White bread. White pasta. White rice. Refined cornbread. Bagels and croissants. Crackers that contain trans fat. Vegetables Creamed or fried vegetables. Vegetables in a cheese sauce. Regular  canned vegetables. Regular canned tomato sauce and paste. Regular tomato and vegetable juices. Fruits Dried fruits. Canned fruit in light or heavy syrup. Fruit juice. Meat and Other Protein Products Fatty cuts of meat.  Ribs, chicken wings, bacon, sausage, bologna, salami, chitterlings, fatback, hot dogs, bratwurst, and packaged luncheon meats. Salted nuts and seeds. Canned beans with salt. Dairy Whole or 2% milk, cream, half-and-half, and cream cheese. Whole-fat or sweetened yogurt. Full-fat cheeses or blue cheese. Nondairy creamers and whipped toppings. Processed cheese, cheese spreads, or cheese curds. Condiments Onion and garlic salt, seasoned salt, table salt, and sea salt. Canned and packaged gravies. Worcestershire sauce. Tartar sauce. Barbecue sauce. Teriyaki sauce. Soy sauce, including reduced sodium. Steak sauce. Fish sauce. Oyster sauce. Cocktail sauce. Horseradish. Ketchup and mustard. Meat flavorings and tenderizers. Bouillon cubes. Hot sauce. Tabasco sauce. Marinades. Taco seasonings. Relishes. Fats and Oils Butter, stick margarine, lard, shortening, ghee, and bacon fat. Coconut, palm kernel, or palm oils. Regular salad dressings. Other Pickles and olives. Salted popcorn and pretzels. The items listed above may not be a complete list of foods and beverages to avoid. Contact your dietitian for more information. WHERE CAN I FIND MORE INFORMATION? National Heart, Lung, and Blood Institute: travelstabloid.com Document Released: 10/24/2011 Document Revised: 03/21/2014 Document Reviewed: 09/08/2013 [facility] Patient Information 2015 [place], [place]. This information is not intended to replace advice given to you by your health care provider. Make sure you discuss any questions you have with your health care provider.   Smoking Cessation Quitting smoking is important to your health and has many advantages. However, it is not always easy to quit since nicotine is a very addictive drug. Oftentimes, people try 3 times or more before being able to quit. This document explains the best ways for you to prepare to quit smoking. Quitting takes hard work and a lot of effort, but you  can do it. ADVANTAGES OF QUITTING SMOKING  You will live longer, feel better, and live better.  Your body will feel the impact of quitting smoking almost immediately.  Within 20 minutes, blood pressure decreases. Your pulse returns to its normal level.  After 8 hours, carbon monoxide levels in the blood return to normal. Your oxygen level increases.  After 24 hours, the chance of having a heart attack starts to decrease. Your breath, hair, and body stop smelling like smoke.  After 48 hours, damaged nerve endings begin to recover. Your sense of taste and smell improve.  After 72 hours, the body is virtually free of nicotine. Your bronchial tubes relax and breathing becomes easier.  After 2 to 12 weeks, lungs can hold more air. Exercise becomes easier and circulation improves.  The risk of having a heart attack, stroke, cancer, or lung disease is greatly reduced.  After 1 year, the risk of coronary heart disease is cut in half.  After 5 years, the risk of stroke falls to the same as a nonsmoker.  After 10 years, the risk of lung cancer is cut in half and the risk of other cancers decreases significantly.  After 15 years, the risk of coronary heart disease drops, usually to the level of a nonsmoker.  If you are pregnant, quitting smoking will improve your chances of having a healthy baby.  The people you live with, especially any children, will be healthier.  You will have extra money to spend on things other than cigarettes. QUESTIONS TO THINK ABOUT BEFORE ATTEMPTING TO QUIT You may want to talk about your answers with your health  care provider.  Why do you want to quit?  If you tried to quit in the past, what helped and what did not?  What will be the most difficult situations for you after you quit? How will you plan to handle them?  Who can help you through the tough times? Your family? Friends? A health care provider?  What pleasures do you get from smoking? What ways  can you still get pleasure if you quit? Here are some questions to ask your health care provider:  How can you help me to be successful at quitting?  What medicine do you think would be best for me and how should I take it?  What should I do if I need more help?  What is smoking withdrawal like? How can I get information on withdrawal? GET READY  Set a quit date.  Change your environment by getting rid of all cigarettes, ashtrays, matches, and lighters in your home, car, or work. Do not let people smoke in your home.  Review your past attempts to quit. Think about what worked and what did not. GET SUPPORT AND ENCOURAGEMENT You have a better chance of being successful if you have help. You can get support in many ways.  Tell your family, friends, and coworkers that you are going to quit and need their support. Ask them not to smoke around you.  Get individual, group, or telephone counseling and support. Programs are available at [employer] and health centers. Call your local health department for information about programs in your area.  Spiritual beliefs and practices may help some smokers quit.  Download a "quit meter" on your computer to keep track of quit statistics, such as how long you have gone without smoking, cigarettes not smoked, and money saved.  Get a self-help book about quitting smoking and staying off tobacco. Moonshine yourself from urges to smoke. Talk to someone, go for a walk, or occupy your time with a task.  Change your normal routine. Take a different route to work. Drink tea instead of coffee. Eat breakfast in a different place.  Reduce your stress. Take a hot bath, exercise, or read a book.  Plan something enjoyable to do every day. Reward yourself for not smoking.  Explore interactive web-based programs that specialize in helping you quit. GET MEDICINE AND USE IT CORRECTLY Medicines can help you stop smoking and  decrease the urge to smoke. Combining medicine with the above behavioral methods and support can greatly increase your chances of successfully quitting smoking.  Nicotine replacement therapy helps deliver nicotine to your body without the negative effects and risks of smoking. Nicotine replacement therapy includes nicotine gum, lozenges, inhalers, nasal sprays, and skin patches. Some may be available over-the-counter and others require a prescription.  Antidepressant medicine helps people abstain from smoking, but how this works is unknown. This medicine is available by prescription.  Nicotinic receptor partial agonist medicine simulates the effect of nicotine in your brain. This medicine is available by prescription. Ask your health care provider for advice about which medicines to use and how to use them based on your health history. Your health care provider will tell you what side effects to look out for if you choose to be on a medicine or therapy. Carefully read the information on the package. Do not use any other product containing nicotine while using a nicotine replacement product.  RELAPSE OR DIFFICULT SITUATIONS Most relapses occur within the first 3  months after quitting. Do not be discouraged if you start smoking again. Remember, most people try several times before finally quitting. You may have symptoms of withdrawal because your body is used to nicotine. You may crave cigarettes, be irritable, feel very hungry, cough often, get headaches, or have difficulty concentrating. The withdrawal symptoms are only temporary. They are strongest when you first quit, but they will go away within 10-14 days. To reduce the chances of relapse, try to:  Avoid drinking alcohol. Drinking lowers your chances of successfully quitting.  Reduce the amount of caffeine you consume. Once you quit smoking, the amount of caffeine in your body increases and can give you symptoms, such as a rapid heartbeat,  sweating, and anxiety.  Avoid smokers because they can make you want to smoke.  Do not let weight gain distract you. Many smokers will gain weight when they quit, usually less than 10 pounds. Eat a healthy diet and stay active. You can always lose the weight gained after you quit.  Find ways to improve your mood other than smoking. FOR MORE INFORMATION  www.smokefree.gov  Document Released: 10/29/2001 Document Revised: 03/21/2014 Document Reviewed: 02/13/2012 [facility] Patient Information 2015 [place], [place]. This information is not intended to replace advice given to you by your health care provider. Make sure you discuss any questions you have with your health care provider.

## 2015-08-22 NOTE — Progress Notes (Signed)
Assessment and Plan:  1. Hypertension -Continue medication, try to  monitor blood pressure at home. Start DASH diet.  Reminder to go to the ER if any CP, SOB, nausea, dizziness, severe HA, changes vision/speech, left arm numbness and tingling and jaw pain.  2. Cholesterol -Continue diet and exercise. Check cholesterol.  - will try crestor 20 mg 1/2 every other day  3. Prediabetes  -Continue diet and exercise. Check A1C  4. Vitamin D Def - check level and continue medications.   5. Obesity with co morbidities - long discussion about weight loss, diet, and exercise  6. Smoking cessation-   instruction/counseling given, counseled patient on the dangers of tobacco use, advised patient to stop smoking, and reviewed strategies to maximize success, patient going to try to quit  7. COPD Advised to stop smoking, will get CXR, continue meds.    Continue diet and meds as discussed. Further disposition pending results of labs. Over 30 minutes of exam, counseling, chart review, and critical decision making was performed  HPI 69 y.o. female  presents for 3 month follow up on hypertension, cholesterol, prediabetes, and vitamin D deficiency.   Her blood pressure has been controlled at home, she is controlled on benicar and bystolic, today their BP is BP: 140/80 mmHg  She does not workout. She denies chest pain, shortness of breath, dizziness. She continues to smoke, has history of COPD via CXR, last CXR 2011. 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 [place]   She is not on cholesterol medication, due to cost, was on livalo due to intolerance of other statins but was too expensive, has been on crestor in the past and tolerated well, was on 10 mg every other day. Her cholesterol is not at goal. The cholesterol last visit was:   Lab Results  Component Value Date   CHOL 238* 03/06/2015   HDL 46  03/06/2015   LDLCALC 167* 03/06/2015   TRIG 123 03/06/2015   CHOLHDL 5.2 03/06/2015    She has been working on diet and exercise for prediabetes, and denies paresthesia of the feet, polydipsia, polyuria and visual disturbances. Last A1C in the office was:  Lab Results  Component Value Date   HGBA1C 5.8* 03/06/2015   Patient is on Vitamin D supplement.   Lab Results  Component Value Date   VD25OH 81 06/15/2014     BMI is Body mass index is 31.94 kg/(m^2)., she is working on diet and exercise. Wt Readings from Last 3 Encounters:  08/22/15 195 lb (88.451 kg)  03/30/15 204 lb (92.534 kg)  03/06/15 198 lb (89.812 kg)     Current Medications:  Current Outpatient Prescriptions on File Prior to Visit  Medication Sig Dispense Refill  . Albuterol Sulfate (PROAIR RESPICLICK) 540 (90 BASE) MCG/ACT AEPB Inhale 2 Act into the lungs every 6 (six) hours as needed (for shortness of breath). 1 each 0  . aspirin 81 MG tablet Take 81 mg by mouth daily.    Marland Kitchen BENICAR 40 MG tablet TAKE 1 TABLET BY MOUTH ONCE DAILY 30 tablet 5  . BYSTOLIC 10 MG tablet TAKE 2 TABLETS EVERY DAY 60 tablet 2  . Cholecalciferol (VITAMIN D) 2000 UNITS tablet Take 6,000 Units by mouth daily.    . diazepam (VALIUM) 2 MG tablet Take 1/2 to 1 tablet 3 x daily if needed for Anxiety 90 tablet 2  .  fluconazole (DIFLUCAN) 150 MG tablet Take 1 tablet (150 mg total) by mouth once. 1 tablet 0  . Fluticasone Furoate-Vilanterol (BREO ELLIPTA) 100-25 MCG/INH AEPB Inhale 1 puff into the lungs daily. Make sure you rinse your mouth out after each use. 1 each 3  . hydrochlorothiazide (HYDRODIURIL) 12.5 MG tablet Take as needed for leg swelling 30 tablet 0  . mupirocin cream (BACTROBAN) 2 % Apply to affected area 2 times daily 30 g 0  . omeprazole (PRILOSEC) 20 MG capsule Take 20 mg by mouth daily.    Marland Kitchen OVER THE COUNTER MEDICATION Calcium with magnesium and zinc    . Pitavastatin Calcium (LIVALO) 2 MG TABS Take 1 tablet (2 mg total) by mouth  daily. 28 tablet 0   No current facility-administered medications on file prior to visit.   Medical History:  Past Medical History  Diagnosis Date  . Hypertension   . Hyperlipidemia   . Anemia    Allergies:  Allergies  Allergen Reactions  . Azithromycin     GI upset  . Crestor [Rosuvastatin]     GI upset  . Klonopin [Clonazepam]   . Lexapro [Escitalopram Oxalate]   . Lisinopril   . Lopid [Gemfibrozil]   . Losartan   . Magnesium-Containing Compounds     GI upset  . Prednisone   . Sulfa Antibiotics   . Vytorin [Ezetimibe-Simvastatin]   . Zoloft [Sertraline Hcl]      Review of Systems:  Review of Systems  Constitutional: Negative.   HENT: Positive for congestion. Negative for ear discharge, ear pain, hearing loss, nosebleeds, sore throat and tinnitus.   Eyes: Negative.   Respiratory: Positive for cough. Negative for hemoptysis, sputum production, shortness of breath, wheezing and stridor.   Cardiovascular: Negative.   Gastrointestinal: Negative.   Genitourinary: Negative.   Musculoskeletal: Negative.   Skin: Negative.   Neurological: Negative.  Negative for headaches.  Endo/Heme/Allergies: Negative.   Psychiatric/Behavioral: Negative.     Family history- Review and unchanged Social history- Review and unchanged Physical Exam: BP 140/80 mmHg  Pulse 92  Temp(Src) 97.5 F (36.4 C) (Temporal)  Resp 14  Ht 5' 5.5" (1.664 m)  Wt 195 lb (88.451 kg)  BMI 31.94 kg/m2  SpO2 93% Wt Readings from Last 3 Encounters:  08/22/15 195 lb (88.451 kg)  03/30/15 204 lb (92.534 kg)  03/06/15 198 lb (89.812 kg)   General Appearance: Well nourished, in no apparent distress. Eyes: PERRLA, EOMs, conjunctiva no swelling or erythema Sinuses: No Frontal/maxillary tenderness ENT/Mouth: Ext aud canals clear, TMs without erythema, bulging. No erythema, swelling, or exudate on post pharynx.  Tonsils not swollen or erythematous. Hearing normal.  Neck: Supple, thyroid normal.   Respiratory: Respiratory effort normal, BS equal bilaterally without rales, rhonchi, wheezing or stridor.  Cardio: RRR with no MRGs. Brisk peripheral pulses without edema.  Abdomen: Soft, + BS,  Non tender, no guarding, rebound, hernias, masses. Lymphatics: Non tender without lymphadenopathy.  Musculoskeletal: Full ROM, 5/5 strength, Normal gait Skin: Warm, dry without rashes, lesions, ecchymosis.  Neuro: Cranial nerves intact. Normal muscle tone, no cerebellar symptoms. Psych: Awake and oriented X 3, normal affect, Insight and Judgment appropriate.    Vicie Mutters, PA-C 2:54 PM [facility]

## 2015-08-23 LAB — HEMOGLOBIN A1C
Hgb A1c MFr Bld: 5.9 % — ABNORMAL HIGH (ref <5.7)
MEAN PLASMA GLUCOSE: 123 mg/dL — AB (ref <117)

## 2015-09-09 ENCOUNTER — Other Ambulatory Visit: Payer: Self-pay | Admitting: Internal Medicine

## 2015-10-20 DIAGNOSIS — J029 Acute pharyngitis, unspecified: Secondary | ICD-10-CM | POA: Diagnosis not present

## 2015-10-20 DIAGNOSIS — J189 Pneumonia, unspecified organism: Secondary | ICD-10-CM | POA: Diagnosis not present

## 2015-11-30 ENCOUNTER — Ambulatory Visit: Payer: Self-pay | Admitting: Internal Medicine

## 2015-12-03 ENCOUNTER — Other Ambulatory Visit: Payer: Self-pay | Admitting: Internal Medicine

## 2015-12-03 DIAGNOSIS — IMO0001 Reserved for inherently not codable concepts without codable children: Secondary | ICD-10-CM

## 2015-12-04 ENCOUNTER — Other Ambulatory Visit: Payer: Self-pay | Admitting: Internal Medicine

## 2015-12-08 ENCOUNTER — Encounter: Payer: Self-pay | Admitting: Internal Medicine

## 2015-12-08 ENCOUNTER — Ambulatory Visit (INDEPENDENT_AMBULATORY_CARE_PROVIDER_SITE_OTHER): Payer: Medicare Other | Admitting: Internal Medicine

## 2015-12-08 VITALS — BP 142/88 | HR 80 | Temp 97.9°F | Resp 16 | Ht 65.5 in | Wt 203.6 lb

## 2015-12-08 DIAGNOSIS — Z0000 Encounter for general adult medical examination without abnormal findings: Secondary | ICD-10-CM

## 2015-12-08 DIAGNOSIS — K219 Gastro-esophageal reflux disease without esophagitis: Secondary | ICD-10-CM

## 2015-12-08 DIAGNOSIS — I10 Essential (primary) hypertension: Secondary | ICD-10-CM

## 2015-12-08 DIAGNOSIS — E559 Vitamin D deficiency, unspecified: Secondary | ICD-10-CM

## 2015-12-08 DIAGNOSIS — Z79899 Other long term (current) drug therapy: Secondary | ICD-10-CM | POA: Diagnosis not present

## 2015-12-08 DIAGNOSIS — Z0001 Encounter for general adult medical examination with abnormal findings: Secondary | ICD-10-CM

## 2015-12-08 DIAGNOSIS — Z23 Encounter for immunization: Secondary | ICD-10-CM

## 2015-12-08 DIAGNOSIS — R7303 Prediabetes: Secondary | ICD-10-CM

## 2015-12-08 DIAGNOSIS — E785 Hyperlipidemia, unspecified: Secondary | ICD-10-CM | POA: Diagnosis not present

## 2015-12-08 DIAGNOSIS — R6889 Other general symptoms and signs: Secondary | ICD-10-CM | POA: Diagnosis not present

## 2015-12-08 DIAGNOSIS — R7309 Other abnormal glucose: Secondary | ICD-10-CM

## 2015-12-08 DIAGNOSIS — F17200 Nicotine dependence, unspecified, uncomplicated: Secondary | ICD-10-CM

## 2015-12-08 LAB — BASIC METABOLIC PANEL WITH GFR
BUN: 10 mg/dL (ref 7–25)
CALCIUM: 8.4 mg/dL — AB (ref 8.6–10.4)
CO2: 23 mmol/L (ref 20–31)
CREATININE: 0.76 mg/dL (ref 0.50–0.99)
Chloride: 104 mmol/L (ref 98–110)
GFR, EST NON AFRICAN AMERICAN: 81 mL/min (ref >=60)
GFR, Est African American: >89 mL/min (ref >=60)
GLUCOSE: 76 mg/dL (ref 65–99)
Potassium: 4 mmol/L (ref 3.5–5.3)
Sodium: 140 mmol/L (ref 135–146)

## 2015-12-08 LAB — CBC WITH DIFFERENTIAL/PLATELET
Basophils Absolute: 0 10*3/uL (ref 0.0–0.1)
Basophils Relative: 0 % (ref 0–1)
Eosinophils Absolute: 0.1 10*3/uL (ref 0.0–0.7)
Eosinophils Relative: 1 % (ref 0–5)
HEMATOCRIT: 44 % (ref 36.0–46.0)
HEMOGLOBIN: 14.6 g/dL (ref 12.0–15.0)
LYMPHS ABS: 2.7 10*3/uL (ref 0.7–4.0)
LYMPHS PCT: 28 % (ref 12–46)
MCH: 29.1 pg (ref 26.0–34.0)
MCHC: 33.2 g/dL (ref 30.0–36.0)
MCV: 87.6 fL (ref 78.0–100.0)
MONO ABS: 0.4 10*3/uL (ref 0.1–1.0)
MPV: 10.4 fL (ref 8.6–12.4)
Monocytes Relative: 4 % (ref 3–12)
NEUTROS ABS: 6.5 10*3/uL (ref 1.7–7.7)
Neutrophils Relative %: 67 % (ref 43–77)
Platelets: 269 10*3/uL (ref 150–400)
RBC: 5.02 MIL/uL (ref 3.87–5.11)
RDW: 13.9 % (ref 11.5–15.5)
WBC: 9.7 10*3/uL (ref 4.0–10.5)

## 2015-12-08 LAB — HEPATIC FUNCTION PANEL
ALT: 9 U/L (ref 6–29)
AST: 11 U/L (ref 10–35)
Albumin: 3.9 g/dL (ref 3.6–5.1)
Alkaline Phosphatase: 112 U/L (ref 33–130)
BILIRUBIN INDIRECT: 0.3 mg/dL (ref 0.2–1.2)
Bilirubin, Direct: 0.1 mg/dL (ref <=0.2)
Total Bilirubin: 0.4 mg/dL (ref 0.2–1.2)
Total Protein: 6.4 g/dL (ref 6.1–8.1)

## 2015-12-08 LAB — LIPID PANEL
Cholesterol: 233 mg/dL — ABNORMAL HIGH (ref 125–200)
HDL: 43 mg/dL — ABNORMAL LOW (ref >=46)
LDL CALC: 161 mg/dL — AB (ref <130)
Total CHOL/HDL Ratio: 5.4 Ratio — ABNORMAL HIGH (ref <=5.0)
Triglycerides: 143 mg/dL (ref <150)
VLDL: 29 mg/dL (ref <30)

## 2015-12-08 LAB — MAGNESIUM: Magnesium: 1.1 mg/dL — ABNORMAL LOW (ref 1.5–2.5)

## 2015-12-08 LAB — TSH: TSH: 1.085 u[IU]/mL (ref 0.350–4.500)

## 2015-12-08 NOTE — Progress Notes (Signed)
Patient ID: Tamara Flores, female   DOB: 09/24/1946, 70 y.o.   MRN: [id number]  Medicare  Annual  Wellness Visit And  Comprehensive Evaluation & Examination   Assessment:   1. Essential hypertension  - TSH  2. Hyperlipidemia  - Lipid panel - TSH  3. Prediabetes  - Hemoglobin A1c - Insulin, random  4. Vitamin D deficiency  - VITAMIN D 25 Hydroxy   5. Tobacco use disorder   6. Gastroesophageal reflux disease   7. Encounter for Medicare annual wellness exam   8. Other abnormal glucose  - Hemoglobin A1c - Insulin, random  9. Need for prophylactic vaccination against Streptococcus pneumoniae (pneumococcus)  - Pneumococcal polysaccharide vaccine 23-valent greater than or equal to 2yo subcutaneous/IM  10. Medication management  - CBC with Differential/Platelet - BASIC METABOLIC PANEL WITH GFR - Hepatic function panel - Magnesium   Plan:   During the course of the visit the patient was educated and counseled about appropriate screening and preventive services including:    Pneumococcal vaccine   Influenza vaccine  Td vaccine  Screening electrocardiogram  Bone densitometry screening  Colorectal cancer screening  Diabetes screening  Glaucoma screening  Nutrition counseling   Advanced directives: requested  Screening recommendations, referrals: Vaccinations:  Immunization History  Administered Date(s) Administered  . Influenza Split 09/19/2012, 09/11/2013, 09/26/2014  . Influenza, High Dose Seasonal PF 08/22/2015  . Pneumococcal Polysaccharide-23 12/08/2015  Tdap vaccine declined Prevnar vaccine deferred at least 2 months after today's Pnvx-23 Shingles vaccine declined Hep B vaccine not indicated  Nutrition assessed and recommended  Colonoscopy Never  & admatly refuses to consider having one Recommended yearly ophthalmology/optometry visit for glaucoma screening and checkup Recommended yearly dental visit for hygiene and checkup Advanced  directives - patienty declined forms.  Conditions/risks identified: BMI: Discussed weight loss, diet, and increase physical activity.  Increase physical activity: AHA recommends 150 minutes of physical activity a week.  Medications reviewed PreDiabetes is not at goal, ACE/ARB therapy: Yes. Urinary Incontinence is an issue: discussed non pharmacology and pharmacology options.  Fall risk: low- discussed PT, home fall assessment, medications.   Subjective:    Tamara Flores presents for Medicare Annual Wellness Visit and follow-up.  Date of last medicare wellness visit was Nov 2015. This very nice 70 y.o. DWF presents for 3 month follow up with Hypertension, Hyperlipidemia, Pre-Diabetes and Vitamin D Deficiency.    Patient is treated for HTN since July 2011 & BP has been controlled at home. Today's BP is 142/88. Patient has had no complaints of any cardiac type chest pain, palpitations, dyspnea/orthopnea/PND, dizziness, claudication, or dependent edema.   Hyperlipidemia is not controlled with diet as patient alleges side-effects with all meds tried in the past, but does relate today that she is willing to retru alternate day dosing of Crestor. . Patient denies myalgias or other med SE's. Last Lipids were 08/22/2015: Cholesterol 242*; HDL 36*; LDL 168*; Triglycerides 188 on       Also, the patient has history of PreDiabetes since Dec 2013 with A1c 5.7% and has had no symptoms of reactive hypoglycemia, diabetic polys, paresthesias or visual blurring.  Last A1c was 5.9% on 08/22/2015.   Further, the patient also has history of Vitamin D Deficiency of "71" in Mar 2011 and supplements vitamin D sporadically. Last vitamin D was 81 in July 2015.  Names of Other Physician/Practitioners you currently use: 1. [facility] here for primary care 2. No recent  eye doctor, last visit -  plans to schedule at [facility] where she is moving in the next 1-2 months 3. Dr ? ,  dentist, last visit 2015  Patient Care Team: Unk Pinto, MD as PCP - General (Internal Medicine) Lafayette Dragon, MD as Consulting Physician (Gastroenterology)  Medication Review: Medication Sig  . Albuterol / PROAIR RESPICLICK  Inhale 2 Act into the lungs every 6 (six) hours as needed (for shortness of breath).  Marland Kitchen aspirin 81 MG tablet Take 81 mg by mouth daily.- takes sporadically  . BENICAR 40 MG TAKE 1 TABLET BY MOUTH ONCE DAILY  . BYSTOLIC 20 MG TAKE 1 TABLET EVERY DAY  . VITAMIN D 2000 UNITS  Take 6,000 Units by mouth daily. - takes sporadically  . diazepam  2 MG tablet Take 1/2 to 1 tablet 3 x day if needed for Nerves. - Needs Office Visit before refill  . BREO ELLIPTA 100-25  Inhale 1 puff into the lungs daily. Make sure you rinse your mouth out after each use.  . hctz 12.5 MG tablet Take as needed for leg swelling - not taking   . mupirocin cream 2 % Apply to affected area 2 times daily  . omeprazole  20 MG capsule Take 20 mg by mouth daily.  . Calcium with magnesium and zinc   . rosuvastatin (CRESTOR) 20 MG tablet Take 1 tab at bedtime. -  not taking   Allergies  Allergen Reactions  . Azithromycin     GI upset  . Crestor [Rosuvastatin]     GI upset  . Klonopin [Clonazepam]   . Lexapro [Escitalopram Oxalate]   . Lisinopril   . Lopid [Gemfibrozil]   . Losartan   . Magnesium-Containing Compounds     GI upset  . Prednisone   . Sulfa Antibiotics   . Vytorin [Ezetimibe-Simvastatin]   . Zoloft [Sertraline Hcl]     Current Problems (verified) Patient Active Problem List   Diagnosis Date Noted  . Encounter for Medicare annual wellness exam 08/22/2015  . Abdominal obesity-metabolic syndrome type 3 ([place]) 08/22/2015  . Obesity 09/26/2014  . Tobacco use disorder 09/26/2014  . Prediabetes 06/15/2014  . Vitamin D deficiency 06/15/2014  . Medication management 06/15/2014  . Hypertension   . Hyperlipidemia   . Anemia     Screening Tests Health Maintenance  Topic  Date Due  . Hepatitis C Screening  1946/09/26  . DEXA SCAN  04/30/2018 (Originally 10/10/2013)  . PNA vac Low Risk Adult (1 of 2 - PCV13) 10/02/2018 (Originally 10/10/2013)  . MAMMOGRAM  10/30/2018 (Originally 10/10/1998)  . TETANUS/TDAP  10/30/2018 (Originally 10/11/1967)  . COLONOSCOPY  12/09/2018 (Originally 10/10/1998)  . ZOSTAVAX  10/01/2019 (Originally 10/10/2008)  . INFLUENZA VACCINE  06/18/2016    Immunization History  Administered Date(s) Administered  . Influenza Split 09/19/2012, 09/11/2013, 09/26/2014  . Influenza, High Dose Seasonal PF 08/22/2015  . Pneumococcal Polysaccharide-23 12/08/2015    Preventative care: Last colonoscopy: Never - Refuses  Past Medical History  Diagnosis Date  . Hypertension   . Hyperlipidemia   . Anemia    No past surgical history on file.  Risk Factors: Tobacco Social History  Substance Use Topics  . Smoking status: Current Every Day Smoker -- 0.50 packs/day    Types: Cigarettes  . Smokeless tobacco: None  . Alcohol Use: No   She does smoke.  Are there smokers in your home (other than you)?  No Alcohol Current alcohol use: none  Caffeine Current caffeine use: coffee 0-2 cups /day  Exercise Current  exercise: none  Nutrition/Diet Current diet: in general, an "unhealthy" diet  Cardiac risk factors: advanced age (older than 78 for men, 27 for women), dyslipidemia and obesity (BMI >= 30 kg/m2).  Depression Screen (Note: if answer to either of the following is "Yes", a more complete depression screening is indicated)   Q1: Over the past two weeks, have you felt down, depressed or hopeless? No  Q2: Over the past two weeks, have you felt little interest or pleasure in doing things? No  Have you lost interest or pleasure in daily life? No  Do you often feel hopeless? No  Do you cry easily over simple problems? No  Activities of Daily Living In your present state of health, do you have any difficulty performing the following  activities?:  Driving? No Managing money?  No Feeding yourself? No Getting from bed to chair? No Climbing a flight of stairs? No Preparing food and eating?: No Bathing or showering? No Getting dressed: No Getting to the toilet? No Using the toilet:No Moving around from place to place: No In the past year have you fallen or had a near fall?:No   Are you sexually active?  No  Do you have more than one partner?  No  Vision Difficulties: No  Hearing Difficulties: No Do you often ask people to speak up or repeat themselves? No Do you experience ringing or noises in your ears? No Do you have difficulty understanding soft or whispered voices? Sometimes.  Cognition  Do you feel that you have a problem with memory?No  Do you often misplace items? No  Do you feel safe at home?  Yes  Advanced directives Does patient have a [place]? No - Declined forma Does patient have a Living Will? No - Declined forms.  ROS: Constitutional: Denies fever, chills, weight loss/gain, headaches, insomnia, fatigue, night sweats, and change in appetite. Eyes: Denies redness, blurred vision, diplopia, discharge, itchy, watery eyes.  ENT: Denies discharge, congestion, post nasal drip, epistaxis, sore throat, earache, hearing loss, dental pain, Tinnitus, Vertigo, Sinus pain, snoring.  Cardio: Denies chest pain, palpitations, irregular heartbeat, syncope, dyspnea, diaphoresis, orthopnea, PND, claudication, edema Respiratory: denies cough, dyspnea, DOE, pleurisy, hoarseness, laryngitis, wheezing.  Gastrointestinal: Denies dysphagia, heartburn, reflux, water brash, pain, cramps, nausea, vomiting, bloating, diarrhea, constipation, hematemesis, melena, hematochezia, jaundice, hemorrhoids Genitourinary: Denies dysuria, frequency, urgency, nocturia, hesitancy, discharge, hematuria, flank pain Breast: Breast lumps, nipple discharge, bleeding.  Musculoskeletal: Denies arthralgia, myalgia,  stiffness, Jt. Swelling, pain, limp, and strain/sprain. Denies falls. Skin: Denies puritis, rash, hives, warts, acne, eczema, changing in skin lesion Neuro: No weakness, tremor, incoordination, spasms, paresthesia, pain Psychiatric: Denies confusion, memory loss, sensory loss. Denies Depression. Endocrine: Denies change in weight, skin, hair change, nocturia, and paresthesia, diabetic polys, visual blurring, hyper / hypo glycemic episodes.  Heme/Lymph: No excessive bleeding, bruising, enlarged lymph nodes  Objective:     BP 142/88 mmHg  Pulse 80  Temp(Src) 97.9 F (36.6 C)  Resp 16  Ht 5' 5.5" (1.664 m)  Wt 203 lb 9.6 oz (92.352 kg)  BMI 33.35 kg/m2  General Appearance: Well nourished, alert, WD/WN, female and in no apparent distress. Eyes: PERRLA, EOMs, conjunctiva no swelling or erythema, normal fundi and vessels. Sinuses: No frontal/maxillary tenderness ENT/Mouth: EACs patent / TMs  nl. Nares clear without erythema, swelling, mucoid exudates. Oral hygiene is good. No erythema, swelling, or exudate. Tongue normal, non-obstructing. Tonsils not swollen or erythematous. Hearing normal.  Neck: Supple, thyroid normal. No bruits, nodes or  JVD. Respiratory: Respiratory effort normal.  BS equal and clear bilateral without rales, rhonci, wheezing or stridor. Cardio: Heart sounds are normal with regular rate and rhythm and no murmurs, rubs or gallops. Peripheral pulses are normal and equal bilaterally without edema. No aortic or femoral bruits. Chest: symmetric with normal excursions and percussion.  Abdomen: Flat, soft  with nl bowel sounds. Nontender, no guarding, rebound, hernias, masses, or organomegaly.  Lymphatics: Non tender without lymphadenopathy.   Musculoskeletal: Full ROM all peripheral extremities, joint stability, 5/5 strength, and normal gait. Skin: Warm and dry without rashes, lesions, cyanosis, clubbing or  ecchymosis.  Neuro: Cranial nerves intact, reflexes equal bilaterally.  Normal muscle tone, no cerebellar symptoms. Sensation intact.  Pysch: Alert and oriented X 3, normal affect, Insight and Judgment appropriate.   Cognitive Testing  Alert? Yes  Normal Appearance?Yes  Oriented to person? Yes  Place? Yes   Time? Yes  Recall of three objects?  Yes  Can perform simple calculations? Yes  Displays appropriate judgment? Yes  Can read the correct time from a watch/clock?Yes  Medicare Attestation I have personally reviewed: The patient's medical and social history Their use of alcohol, tobacco or illicit drugs Their current medications and supplements The patient's functional ability including ADLs,fall risks, home safety risks, cognitive, and hearing and visual impairment Diet and physical activities Evidence for depression or mood disorders  The patient's weight, height, BMI, and visual acuity have been recorded in the chart.  I have made referrals, counseling, and provided education to the patient based on review of the above and I have provided the patient with a written personalized care plan for preventive services.  Over 40 minutes of exam, counseling, chart review was performed.  Eather Chaires DAVID, MD   12/08/2015

## 2015-12-08 NOTE — Patient Instructions (Signed)

## 2015-12-09 LAB — HEMOGLOBIN A1C
HEMOGLOBIN A1C: 5.9 % — AB (ref <5.7)
MEAN PLASMA GLUCOSE: 123 mg/dL — AB (ref <117)

## 2015-12-09 LAB — VITAMIN D 25 HYDROXY (VIT D DEFICIENCY, FRACTURES): Vit D, 25-Hydroxy: 35 ng/mL (ref 30–100)

## 2015-12-11 LAB — INSULIN, RANDOM: INSULIN: 6.2 u[IU]/mL (ref 2.0–19.6)

## 2016-01-03 ENCOUNTER — Other Ambulatory Visit: Payer: Self-pay | Admitting: Internal Medicine

## 2016-01-03 DIAGNOSIS — I10 Essential (primary) hypertension: Secondary | ICD-10-CM

## 2016-01-03 DIAGNOSIS — F411 Generalized anxiety disorder: Secondary | ICD-10-CM

## 2016-01-03 MED ORDER — DIAZEPAM 2 MG PO TABS: ORAL_TABLET | ORAL | Status: DC

## 2016-01-04 ENCOUNTER — Other Ambulatory Visit: Payer: Self-pay | Admitting: Internal Medicine

## 2016-02-26 DIAGNOSIS — H353110 Nonexudative age-related macular degeneration, right eye, stage unspecified: Secondary | ICD-10-CM | POA: Diagnosis not present

## 2016-02-26 DIAGNOSIS — H524 Presbyopia: Secondary | ICD-10-CM | POA: Diagnosis not present

## 2016-02-26 DIAGNOSIS — H40003 Preglaucoma, unspecified, bilateral: Secondary | ICD-10-CM | POA: Diagnosis not present

## 2016-02-26 DIAGNOSIS — H2513 Age-related nuclear cataract, bilateral: Secondary | ICD-10-CM | POA: Diagnosis not present

## 2016-04-17 ENCOUNTER — Other Ambulatory Visit: Payer: Self-pay | Admitting: Internal Medicine

## 2016-06-10 ENCOUNTER — Other Ambulatory Visit: Payer: Self-pay | Admitting: Internal Medicine

## 2016-07-09 ENCOUNTER — Other Ambulatory Visit: Payer: Self-pay

## 2016-07-09 DIAGNOSIS — I10 Essential (primary) hypertension: Secondary | ICD-10-CM

## 2016-07-10 NOTE — Telephone Encounter (Signed)
Per Dr. Melford Aase no refills on Benicar.

## 2016-07-10 NOTE — Telephone Encounter (Signed)
Per Dr. Melford Aase refills for Benicar was rejected due to pt being told to get refills from New provider. Pt no longer lives in [place].

## 2016-07-19 ENCOUNTER — Other Ambulatory Visit: Payer: Self-pay | Admitting: Internal Medicine

## 2016-07-19 DIAGNOSIS — F411 Generalized anxiety disorder: Secondary | ICD-10-CM

## 2016-08-04 ENCOUNTER — Other Ambulatory Visit: Payer: Self-pay | Admitting: Internal Medicine

## 2016-08-04 DIAGNOSIS — I10 Essential (primary) hypertension: Secondary | ICD-10-CM

## 2016-08-14 DIAGNOSIS — I10 Essential (primary) hypertension: Secondary | ICD-10-CM | POA: Diagnosis not present

## 2016-08-14 DIAGNOSIS — E7800 Pure hypercholesterolemia, unspecified: Secondary | ICD-10-CM | POA: Diagnosis not present

## 2016-08-14 DIAGNOSIS — Z1159 Encounter for screening for other viral diseases: Secondary | ICD-10-CM | POA: Diagnosis not present

## 2016-08-14 DIAGNOSIS — J301 Allergic rhinitis due to pollen: Secondary | ICD-10-CM | POA: Diagnosis not present

## 2016-08-14 DIAGNOSIS — Z23 Encounter for immunization: Secondary | ICD-10-CM | POA: Diagnosis not present

## 2016-08-14 DIAGNOSIS — Z1231 Encounter for screening mammogram for malignant neoplasm of breast: Secondary | ICD-10-CM | POA: Diagnosis not present

## 2016-08-14 DIAGNOSIS — Z780 Asymptomatic menopausal state: Secondary | ICD-10-CM | POA: Diagnosis not present

## 2016-08-14 DIAGNOSIS — R062 Wheezing: Secondary | ICD-10-CM | POA: Diagnosis not present

## 2016-08-14 DIAGNOSIS — F419 Anxiety disorder, unspecified: Secondary | ICD-10-CM | POA: Diagnosis not present

## 2016-08-27 DIAGNOSIS — R3915 Urgency of urination: Secondary | ICD-10-CM | POA: Diagnosis not present

## 2016-08-27 DIAGNOSIS — R300 Dysuria: Secondary | ICD-10-CM | POA: Diagnosis not present

## 2016-08-27 DIAGNOSIS — E7800 Pure hypercholesterolemia, unspecified: Secondary | ICD-10-CM | POA: Diagnosis not present

## 2016-08-27 DIAGNOSIS — Z1159 Encounter for screening for other viral diseases: Secondary | ICD-10-CM | POA: Diagnosis not present

## 2016-08-27 DIAGNOSIS — I10 Essential (primary) hypertension: Secondary | ICD-10-CM | POA: Diagnosis not present

## 2016-12-11 DIAGNOSIS — F419 Anxiety disorder, unspecified: Secondary | ICD-10-CM | POA: Diagnosis not present

## 2016-12-11 DIAGNOSIS — Z720 Tobacco use: Secondary | ICD-10-CM | POA: Diagnosis not present

## 2016-12-11 DIAGNOSIS — J309 Allergic rhinitis, unspecified: Secondary | ICD-10-CM | POA: Diagnosis not present

## 2016-12-11 DIAGNOSIS — J019 Acute sinusitis, unspecified: Secondary | ICD-10-CM | POA: Diagnosis not present

## 2016-12-11 DIAGNOSIS — E785 Hyperlipidemia, unspecified: Secondary | ICD-10-CM | POA: Diagnosis not present

## 2016-12-11 DIAGNOSIS — E559 Vitamin D deficiency, unspecified: Secondary | ICD-10-CM | POA: Diagnosis not present

## 2016-12-11 DIAGNOSIS — I10 Essential (primary) hypertension: Secondary | ICD-10-CM | POA: Diagnosis not present

## 2017-03-05 DIAGNOSIS — E785 Hyperlipidemia, unspecified: Secondary | ICD-10-CM | POA: Diagnosis not present

## 2017-03-05 DIAGNOSIS — E559 Vitamin D deficiency, unspecified: Secondary | ICD-10-CM | POA: Diagnosis not present

## 2017-03-05 DIAGNOSIS — I10 Essential (primary) hypertension: Secondary | ICD-10-CM | POA: Diagnosis not present

## 2017-03-12 DIAGNOSIS — I10 Essential (primary) hypertension: Secondary | ICD-10-CM | POA: Diagnosis not present

## 2017-03-12 DIAGNOSIS — E785 Hyperlipidemia, unspecified: Secondary | ICD-10-CM | POA: Diagnosis not present

## 2017-03-12 DIAGNOSIS — A630 Anogenital (venereal) warts: Secondary | ICD-10-CM | POA: Diagnosis not present

## 2017-04-04 DIAGNOSIS — J019 Acute sinusitis, unspecified: Secondary | ICD-10-CM | POA: Diagnosis not present

## 2017-05-01 DIAGNOSIS — N898 Other specified noninflammatory disorders of vagina: Secondary | ICD-10-CM | POA: Diagnosis not present

## 2017-05-01 DIAGNOSIS — A630 Anogenital (venereal) warts: Secondary | ICD-10-CM | POA: Diagnosis not present

## 2017-05-01 DIAGNOSIS — Z124 Encounter for screening for malignant neoplasm of cervix: Secondary | ICD-10-CM | POA: Diagnosis not present

## 2017-05-01 DIAGNOSIS — Z1231 Encounter for screening mammogram for malignant neoplasm of breast: Secondary | ICD-10-CM | POA: Diagnosis not present

## 2017-05-01 DIAGNOSIS — Z1211 Encounter for screening for malignant neoplasm of colon: Secondary | ICD-10-CM | POA: Diagnosis not present

## 2017-05-01 DIAGNOSIS — Z202 Contact with and (suspected) exposure to infections with a predominantly sexual mode of transmission: Secondary | ICD-10-CM | POA: Diagnosis not present

## 2017-05-22 DIAGNOSIS — D071 Carcinoma in situ of vulva: Secondary | ICD-10-CM | POA: Diagnosis not present

## 2017-05-22 DIAGNOSIS — E785 Hyperlipidemia, unspecified: Secondary | ICD-10-CM | POA: Diagnosis not present

## 2017-05-22 DIAGNOSIS — I10 Essential (primary) hypertension: Secondary | ICD-10-CM | POA: Diagnosis not present

## 2017-05-22 DIAGNOSIS — F17210 Nicotine dependence, cigarettes, uncomplicated: Secondary | ICD-10-CM | POA: Diagnosis not present

## 2017-05-28 DIAGNOSIS — A630 Anogenital (venereal) warts: Secondary | ICD-10-CM | POA: Diagnosis not present

## 2017-05-28 DIAGNOSIS — I10 Essential (primary) hypertension: Secondary | ICD-10-CM | POA: Diagnosis not present

## 2017-05-28 DIAGNOSIS — F17210 Nicotine dependence, cigarettes, uncomplicated: Secondary | ICD-10-CM | POA: Diagnosis not present

## 2017-05-28 DIAGNOSIS — E785 Hyperlipidemia, unspecified: Secondary | ICD-10-CM | POA: Diagnosis not present

## 2017-05-28 DIAGNOSIS — D071 Carcinoma in situ of vulva: Secondary | ICD-10-CM | POA: Diagnosis not present

## 2017-06-04 ENCOUNTER — Other Ambulatory Visit: Payer: Self-pay | Admitting: Internal Medicine

## 2017-06-04 DIAGNOSIS — F411 Generalized anxiety disorder: Secondary | ICD-10-CM

## 2017-09-10 DIAGNOSIS — Z720 Tobacco use: Secondary | ICD-10-CM | POA: Diagnosis not present

## 2017-09-10 DIAGNOSIS — Z23 Encounter for immunization: Secondary | ICD-10-CM | POA: Diagnosis not present

## 2017-09-10 DIAGNOSIS — J309 Allergic rhinitis, unspecified: Secondary | ICD-10-CM | POA: Diagnosis not present

## 2017-09-10 DIAGNOSIS — F419 Anxiety disorder, unspecified: Secondary | ICD-10-CM | POA: Diagnosis not present

## 2017-09-10 DIAGNOSIS — I10 Essential (primary) hypertension: Secondary | ICD-10-CM | POA: Diagnosis not present

## 2017-09-15 DIAGNOSIS — C519 Malignant neoplasm of vulva, unspecified: Secondary | ICD-10-CM | POA: Diagnosis not present

## 2017-09-15 DIAGNOSIS — A630 Anogenital (venereal) warts: Secondary | ICD-10-CM | POA: Diagnosis not present

## 2017-09-24 DIAGNOSIS — F17200 Nicotine dependence, unspecified, uncomplicated: Secondary | ICD-10-CM | POA: Diagnosis not present

## 2017-09-24 DIAGNOSIS — E782 Mixed hyperlipidemia: Secondary | ICD-10-CM | POA: Diagnosis not present

## 2017-09-24 DIAGNOSIS — F410 Panic disorder [episodic paroxysmal anxiety] without agoraphobia: Secondary | ICD-10-CM | POA: Diagnosis not present

## 2017-09-24 DIAGNOSIS — Z23 Encounter for immunization: Secondary | ICD-10-CM | POA: Diagnosis not present

## 2017-09-24 DIAGNOSIS — J3089 Other allergic rhinitis: Secondary | ICD-10-CM | POA: Diagnosis not present

## 2017-09-24 DIAGNOSIS — Z9119 Patient's noncompliance with other medical treatment and regimen: Secondary | ICD-10-CM | POA: Diagnosis not present

## 2017-09-24 DIAGNOSIS — I10 Essential (primary) hypertension: Secondary | ICD-10-CM | POA: Diagnosis not present

## 2017-12-18 DIAGNOSIS — A630 Anogenital (venereal) warts: Secondary | ICD-10-CM | POA: Diagnosis not present

## 2017-12-18 DIAGNOSIS — T3695XA Adverse effect of unspecified systemic antibiotic, initial encounter: Secondary | ICD-10-CM | POA: Diagnosis not present

## 2017-12-18 DIAGNOSIS — H9313 Tinnitus, bilateral: Secondary | ICD-10-CM | POA: Diagnosis not present

## 2018-01-01 DIAGNOSIS — J309 Allergic rhinitis, unspecified: Secondary | ICD-10-CM | POA: Diagnosis not present

## 2018-01-01 DIAGNOSIS — J019 Acute sinusitis, unspecified: Secondary | ICD-10-CM | POA: Diagnosis not present

## 2018-03-26 DIAGNOSIS — A630 Anogenital (venereal) warts: Secondary | ICD-10-CM | POA: Diagnosis not present

## 2018-06-16 DIAGNOSIS — I10 Essential (primary) hypertension: Secondary | ICD-10-CM | POA: Diagnosis not present

## 2018-06-16 DIAGNOSIS — E785 Hyperlipidemia, unspecified: Secondary | ICD-10-CM | POA: Diagnosis not present

## 2018-06-30 DIAGNOSIS — E785 Hyperlipidemia, unspecified: Secondary | ICD-10-CM | POA: Diagnosis not present

## 2018-07-09 DIAGNOSIS — A630 Anogenital (venereal) warts: Secondary | ICD-10-CM | POA: Diagnosis not present

## 2018-09-14 DIAGNOSIS — Z23 Encounter for immunization: Secondary | ICD-10-CM | POA: Diagnosis not present

## 2018-10-06 DIAGNOSIS — E785 Hyperlipidemia, unspecified: Secondary | ICD-10-CM | POA: Diagnosis not present

## 2018-10-06 DIAGNOSIS — I10 Essential (primary) hypertension: Secondary | ICD-10-CM | POA: Diagnosis not present

## 2018-10-20 DIAGNOSIS — E785 Hyperlipidemia, unspecified: Secondary | ICD-10-CM | POA: Diagnosis not present

## 2018-10-20 DIAGNOSIS — F419 Anxiety disorder, unspecified: Secondary | ICD-10-CM | POA: Diagnosis not present

## 2018-10-20 DIAGNOSIS — I10 Essential (primary) hypertension: Secondary | ICD-10-CM | POA: Diagnosis not present

## 2018-10-20 DIAGNOSIS — Z720 Tobacco use: Secondary | ICD-10-CM | POA: Diagnosis not present

## 2018-10-20 DIAGNOSIS — K219 Gastro-esophageal reflux disease without esophagitis: Secondary | ICD-10-CM | POA: Diagnosis not present

## 2022-01-23 ENCOUNTER — Encounter (HOSPITAL_BASED_OUTPATIENT_CLINIC_OR_DEPARTMENT_OTHER): Payer: Self-pay

## 2022-01-23 ENCOUNTER — Inpatient Hospital Stay (HOSPITAL_BASED_OUTPATIENT_CLINIC_OR_DEPARTMENT_OTHER)
Admission: EM | Admit: 2022-01-23 | Discharge: 2022-01-25 | DRG: 189 | Disposition: A | Discharge status: Home / Self Care | Payer: Medicare Other | Attending: Internal Medicine | Admitting: Internal Medicine

## 2022-01-23 ENCOUNTER — Other Ambulatory Visit: Payer: Self-pay

## 2022-01-23 ENCOUNTER — Emergency Department (HOSPITAL_BASED_OUTPATIENT_CLINIC_OR_DEPARTMENT_OTHER): Payer: Medicare Other

## 2022-01-23 DIAGNOSIS — Z881 Allergy status to other antibiotic agents status: Secondary | ICD-10-CM | POA: Diagnosis not present

## 2022-01-23 DIAGNOSIS — J441 Chronic obstructive pulmonary disease with (acute) exacerbation: Secondary | ICD-10-CM | POA: Diagnosis present

## 2022-01-23 DIAGNOSIS — K5900 Constipation, unspecified: Secondary | ICD-10-CM | POA: Diagnosis present

## 2022-01-23 DIAGNOSIS — F17210 Nicotine dependence, cigarettes, uncomplicated: Secondary | ICD-10-CM | POA: Diagnosis present

## 2022-01-23 DIAGNOSIS — I10 Essential (primary) hypertension: Secondary | ICD-10-CM | POA: Diagnosis not present

## 2022-01-23 DIAGNOSIS — E785 Hyperlipidemia, unspecified: Secondary | ICD-10-CM | POA: Diagnosis present

## 2022-01-23 DIAGNOSIS — E669 Obesity, unspecified: Secondary | ICD-10-CM | POA: Diagnosis not present

## 2022-01-23 DIAGNOSIS — J9602 Acute respiratory failure with hypercapnia: Secondary | ICD-10-CM | POA: Diagnosis not present

## 2022-01-23 DIAGNOSIS — Z882 Allergy status to sulfonamides status: Secondary | ICD-10-CM | POA: Diagnosis not present

## 2022-01-23 DIAGNOSIS — Z79899 Other long term (current) drug therapy: Secondary | ICD-10-CM

## 2022-01-23 DIAGNOSIS — Z6830 Body mass index (BMI) 30.0-30.9, adult: Secondary | ICD-10-CM

## 2022-01-23 DIAGNOSIS — Z888 Allergy status to other drugs, medicaments and biological substances status: Secondary | ICD-10-CM | POA: Diagnosis not present

## 2022-01-23 DIAGNOSIS — Z20822 Contact with and (suspected) exposure to covid-19: Secondary | ICD-10-CM | POA: Diagnosis not present

## 2022-01-23 DIAGNOSIS — J439 Emphysema, unspecified: Secondary | ICD-10-CM | POA: Diagnosis present

## 2022-01-23 DIAGNOSIS — Z6833 Body mass index (BMI) 33.0-33.9, adult: Secondary | ICD-10-CM

## 2022-01-23 DIAGNOSIS — J9601 Acute respiratory failure with hypoxia: Secondary | ICD-10-CM | POA: Diagnosis not present

## 2022-01-23 DIAGNOSIS — F17200 Nicotine dependence, unspecified, uncomplicated: Secondary | ICD-10-CM | POA: Diagnosis present

## 2022-01-23 DIAGNOSIS — Z7951 Long term (current) use of inhaled steroids: Secondary | ICD-10-CM | POA: Diagnosis not present

## 2022-01-23 DIAGNOSIS — R0602 Shortness of breath: Secondary | ICD-10-CM

## 2022-01-23 LAB — I-STAT VENOUS BLOOD GAS, ED
Acid-Base Excess: 10 mmol/L — ABNORMAL HIGH (ref 0.0–2.0)
Bicarbonate: 37.7 mmol/L — ABNORMAL HIGH (ref 20.0–28.0)
Calcium, Ion: 1.18 mmol/L (ref 1.15–1.40)
HCT: 41 % (ref 36.0–46.0)
Hemoglobin: 13.9 g/dL (ref 12.0–15.0)
O2 Saturation: 91 %
Potassium: 3.5 mmol/L (ref 3.5–5.1)
Sodium: 142 mmol/L (ref 135–145)
TCO2: 40 mmol/L — ABNORMAL HIGH (ref 22–32)
pCO2, Ven: 63.6 mmHg — ABNORMAL HIGH (ref 44–60)
pH, Ven: 7.381 (ref 7.25–7.43)
pO2, Ven: 64 mmHg — ABNORMAL HIGH (ref 32–45)

## 2022-01-23 LAB — COMPREHENSIVE METABOLIC PANEL
ALT: 7 U/L (ref 0–44)
AST: 10 U/L — ABNORMAL LOW (ref 15–41)
Albumin: 4 g/dL (ref 3.5–5.0)
Alkaline Phosphatase: 94 U/L (ref 38–126)
Anion gap: 10 (ref 5–15)
BUN: 15 mg/dL (ref 8–23)
CO2: 33 mmol/L — ABNORMAL HIGH (ref 22–32)
Calcium: 9.1 mg/dL (ref 8.9–10.3)
Chloride: 101 mmol/L (ref 98–111)
Creatinine, Ser: 0.84 mg/dL (ref 0.44–1.00)
GFR, Estimated: >60 mL/min (ref >60)
Glucose, Bld: 116 mg/dL — ABNORMAL HIGH (ref 70–99)
Potassium: 3.6 mmol/L (ref 3.5–5.1)
Sodium: 144 mmol/L (ref 135–145)
Total Bilirubin: 0.4 mg/dL (ref 0.3–1.2)
Total Protein: 7 g/dL (ref 6.5–8.1)

## 2022-01-23 LAB — CBC WITH DIFFERENTIAL/PLATELET
Abs Immature Granulocytes: 0.03 10*3/uL (ref 0.00–0.07)
Basophils Absolute: 0 10*3/uL (ref 0.0–0.1)
Basophils Relative: 0 %
Eosinophils Absolute: 0.2 10*3/uL (ref 0.0–0.5)
Eosinophils Relative: 2 %
HCT: 44.3 % (ref 36.0–46.0)
Hemoglobin: 13.8 g/dL (ref 12.0–15.0)
Immature Granulocytes: 0 %
Lymphocytes Relative: 28 %
Lymphs Abs: 2.5 10*3/uL (ref 0.7–4.0)
MCH: 27.2 pg (ref 26.0–34.0)
MCHC: 31.2 g/dL (ref 30.0–36.0)
MCV: 87.2 fL (ref 80.0–100.0)
Monocytes Absolute: 0.5 10*3/uL (ref 0.1–1.0)
Monocytes Relative: 6 %
Neutro Abs: 5.6 10*3/uL (ref 1.7–7.7)
Neutrophils Relative %: 64 %
Platelets: 184 10*3/uL (ref 150–400)
RBC: 5.08 MIL/uL (ref 3.87–5.11)
RDW: 14.3 % (ref 11.5–15.5)
WBC: 8.9 10*3/uL (ref 4.0–10.5)
nRBC: 0 % (ref 0.0–0.2)

## 2022-01-23 LAB — RESP PANEL BY RT-PCR (FLU A&B, COVID) ARPGX2
Influenza A by PCR: NEGATIVE
Influenza B by PCR: NEGATIVE
SARS Coronavirus 2 by RT PCR: NEGATIVE

## 2022-01-23 LAB — TROPONIN I (HIGH SENSITIVITY): Troponin I (High Sensitivity): 4 ng/L (ref <18)

## 2022-01-23 LAB — BRAIN NATRIURETIC PEPTIDE: B Natriuretic Peptide: 53.6 pg/mL (ref 0.0–100.0)

## 2022-01-23 MED ORDER — ENOXAPARIN SODIUM 40 MG/0.4ML IJ SOSY
40.0000 mg | PREFILLED_SYRINGE | INTRAMUSCULAR | Status: DC
  Administered 2022-01-23 – 2022-01-24 (×2): 40 mg via SUBCUTANEOUS
  Filled 2022-01-23 (×2): qty 0.4

## 2022-01-23 MED ORDER — MENTHOL 3 MG MT LOZG: 1.0000 | LOZENGE | OROMUCOSAL | Status: DC | PRN

## 2022-01-23 MED ORDER — IPRATROPIUM-ALBUTEROL 0.5-2.5 (3) MG/3ML IN SOLN
3.0000 mL | RESPIRATORY_TRACT | Status: AC | PRN
  Administered 2022-01-23: 3 mL via RESPIRATORY_TRACT

## 2022-01-23 MED ORDER — AZITHROMYCIN 250 MG PO TABS
500.0000 mg | ORAL_TABLET | Freq: Every day | ORAL | Status: DC
  Administered 2022-01-24: 18:00:00 500 mg via ORAL
  Filled 2022-01-23: qty 2

## 2022-01-23 MED ORDER — DEXAMETHASONE SODIUM PHOSPHATE 10 MG/ML IJ SOLN
6.0000 mg | INTRAMUSCULAR | Status: DC
  Administered 2022-01-23 – 2022-01-24 (×2): 6 mg via INTRAVENOUS
  Filled 2022-01-23 (×2): qty 1

## 2022-01-23 MED ORDER — ONDANSETRON HCL 4 MG/2ML IJ SOLN: 4.0000 mg | Freq: Four times a day (QID) | INTRAMUSCULAR | Status: DC | PRN

## 2022-01-23 MED ORDER — MOMETASONE FURO-FORMOTEROL FUM 200-5 MCG/ACT IN AERO
2.0000 | INHALATION_SPRAY | Freq: Two times a day (BID) | RESPIRATORY_TRACT | Status: DC
  Administered 2022-01-24 – 2022-01-25 (×3): 2 via RESPIRATORY_TRACT
  Filled 2022-01-23: qty 8.8

## 2022-01-23 MED ORDER — ALBUTEROL SULFATE (2.5 MG/3ML) 0.083% IN NEBU: 2.5000 mg | INHALATION_SOLUTION | RESPIRATORY_TRACT | Status: DC | PRN

## 2022-01-23 MED ORDER — LATANOPROST 0.005 % OP SOLN
1.0000 [drp] | Freq: Every day | OPHTHALMIC | Status: DC
  Administered 2022-01-23 – 2022-01-24 (×2): 1 [drp] via OPHTHALMIC
  Filled 2022-01-23: qty 2.5

## 2022-01-23 MED ORDER — IPRATROPIUM-ALBUTEROL 0.5-2.5 (3) MG/3ML IN SOLN
RESPIRATORY_TRACT | Status: AC
  Filled 2022-01-23: qty 3

## 2022-01-23 MED ORDER — IPRATROPIUM-ALBUTEROL 0.5-2.5 (3) MG/3ML IN SOLN
3.0000 mL | Freq: Three times a day (TID) | RESPIRATORY_TRACT | Status: DC
  Administered 2022-01-23: 3 mL via RESPIRATORY_TRACT
  Filled 2022-01-23: qty 3

## 2022-01-23 MED ORDER — ALBUTEROL SULFATE HFA 108 (90 BASE) MCG/ACT IN AERS
4.0000 | INHALATION_SPRAY | Freq: Once | RESPIRATORY_TRACT | Status: DC
  Filled 2022-01-23: qty 6.7

## 2022-01-23 MED ORDER — PANTOPRAZOLE SODIUM 40 MG PO TBEC
40.0000 mg | DELAYED_RELEASE_TABLET | Freq: Every day | ORAL | Status: DC
  Administered 2022-01-24: 10:00:00 40 mg via ORAL
  Filled 2022-01-23 (×2): qty 1

## 2022-01-23 MED ORDER — AEROCHAMBER PLUS FLO-VU MISC
1.0000 | Freq: Once | Status: DC
  Filled 2022-01-23: qty 1

## 2022-01-23 MED ORDER — ONDANSETRON HCL 4 MG PO TABS: 4.0000 mg | ORAL_TABLET | Freq: Four times a day (QID) | ORAL | Status: DC | PRN

## 2022-01-23 MED ORDER — MAGNESIUM SULFATE 2 GM/50ML IV SOLN
2.0000 g | Freq: Once | INTRAVENOUS | Status: AC
  Administered 2022-01-23: 2 g via INTRAVENOUS
  Filled 2022-01-23: qty 50

## 2022-01-23 MED ORDER — IPRATROPIUM-ALBUTEROL 0.5-2.5 (3) MG/3ML IN SOLN
3.0000 mL | Freq: Once | RESPIRATORY_TRACT | Status: AC
  Administered 2022-01-23: 3 mL via RESPIRATORY_TRACT

## 2022-01-23 MED ORDER — ACETAMINOPHEN 650 MG RE SUPP: 650.0000 mg | Freq: Four times a day (QID) | RECTAL | Status: DC | PRN

## 2022-01-23 MED ORDER — ACETAMINOPHEN 325 MG PO TABS: 650.0000 mg | ORAL_TABLET | Freq: Four times a day (QID) | ORAL | Status: DC | PRN

## 2022-01-23 MED ORDER — AEROCHAMBER PLUS FLO-VU LARGE MISC
1.0000 | Freq: Once | Status: AC
  Administered 2022-01-23: 1
  Filled 2022-01-23: qty 1

## 2022-01-23 MED ORDER — PROPRANOLOL HCL ER 80 MG PO CP24
80.0000 mg | ORAL_CAPSULE | Freq: Every day | ORAL | Status: DC
  Administered 2022-01-23 – 2022-01-24 (×2): 80 mg via ORAL
  Filled 2022-01-23 (×2): qty 1

## 2022-01-23 MED ORDER — DM-GUAIFENESIN ER 30-600 MG PO TB12
2.0000 | ORAL_TABLET | Freq: Two times a day (BID) | ORAL | Status: DC
  Administered 2022-01-24 (×2): 1 via ORAL
  Filled 2022-01-23 (×3): qty 2
  Filled 2022-01-23: qty 1

## 2022-01-23 MED ORDER — SODIUM CHLORIDE 0.9 % IV SOLN
500.0000 mg | INTRAVENOUS | Status: AC
  Administered 2022-01-23: 500 mg via INTRAVENOUS
  Filled 2022-01-23: qty 5

## 2022-01-23 MED ORDER — IPRATROPIUM-ALBUTEROL 0.5-2.5 (3) MG/3ML IN SOLN
3.0000 mL | Freq: Three times a day (TID) | RESPIRATORY_TRACT | Status: DC
  Administered 2022-01-24 – 2022-01-25 (×3): 3 mL via RESPIRATORY_TRACT
  Filled 2022-01-23 (×3): qty 3

## 2022-01-23 MED ORDER — ALBUTEROL SULFATE HFA 108 (90 BASE) MCG/ACT IN AERS
2.0000 | INHALATION_SPRAY | Freq: Once | RESPIRATORY_TRACT | Status: AC
  Administered 2022-01-23: 2 via RESPIRATORY_TRACT

## 2022-01-23 MED ORDER — IPRATROPIUM-ALBUTEROL 0.5-2.5 (3) MG/3ML IN SOLN
RESPIRATORY_TRACT | Status: AC
  Administered 2022-01-23: 3 mL via RESPIRATORY_TRACT
  Filled 2022-01-23: qty 6

## 2022-01-23 MED ORDER — IPRATROPIUM-ALBUTEROL 0.5-2.5 (3) MG/3ML IN SOLN
3.0000 mL | Freq: Four times a day (QID) | RESPIRATORY_TRACT | Status: DC
  Administered 2022-01-23: 3 mL via RESPIRATORY_TRACT
  Filled 2022-01-23: qty 3

## 2022-01-23 MED ORDER — LOSARTAN POTASSIUM 50 MG PO TABS
100.0000 mg | ORAL_TABLET | Freq: Every day | ORAL | Status: DC
  Administered 2022-01-24 – 2022-01-25 (×2): 100 mg via ORAL
  Filled 2022-01-23 (×2): qty 2

## 2022-01-23 NOTE — Assessment & Plan Note (Addendum)
COPD with exacerbation ?History of tobacco abuse ?-Patient normally does not wear oxygen at home and is trying to cut down on her smoking recently. ?-Treated with IV Decadron along with Dulera and nebs. ?-COVID-19 and influenza testing negative ?-Chest x-ray showed left lateral base opacity, atelectasis/infiltrate or mass.  Procalcitonin less than 0.1.  Will need repeat chest x-ray as an outpatient and possible CT chest.  Will need outpatient evaluation by pulmonary ?-Respiratory status improving.  Currently on room air. ?-Feels much better.  She will be discharged home today on Decadron 6 mg daily for a week.  Continue home inhaled regimen. ?

## 2022-01-23 NOTE — ED Triage Notes (Signed)
Shortness of breath with recent diagnosis of bronchitis. ?

## 2022-01-23 NOTE — ED Notes (Signed)
Pt in bed, pt had removed nasal canula and desatted into mid 80s, replaced [place], pt satting 90% on 2L ?

## 2022-01-23 NOTE — Progress Notes (Signed)
Plan of Care Note for accepted transfer ? ? ?Patient: DELFINA SCHREURS MRN: [id number]   DOA: 01/23/2022 ? ?Facility requesting transfer: [facility]. ?Requesting Provider: Sherwood Gambler, MD ?Reason for transfer: Acute respiratory failure with hypoxia and hypercapnia. ?Facility course:  ?76 year old female with a past medical history of hypertension, hyperlipidemia, prediabetes, class I obesity, vitamin D deficiency, active smoker but not formally diagnosed with COPD although the patient has albuterol MDI and Breo Ellipta inhaler among her medications, history of unspecified side effects to glucocorticoids who presented to the emergency department with complaints of dyspnea after having severe coughing spells.  She was recently seen at an urgent care center.  COVID and influenza PCR was negative.  She was diagnosed with bronchitis, given amoxicillin and an inhaler.  Her initial O2 sat in the ER was 86% on room air.  She is currently requiring 2 LPM via an ED to keep her O2 sat between 93 to 95%.  Her VBG showed hypoxia and hypercapnia.  Imaging with COPD pattern but no acute infiltrate.  She received bronchodilators and magnesium sulfate.  Given her CO2 retention, a PCU bed was requested in case she needs BiPAP assistance. ? ?Plan of care: ?The patient is accepted for admission to Progressive unit, at [facility]. ? ?Author: ?Reubin Milan, MD ?01/23/2022 ? ?Check [email or web address] for on-call coverage. ? ?Nursing staff, Please call [place] number on Amion as soon as patient's arrival, so appropriate admitting provider can evaluate the pt. ?

## 2022-01-23 NOTE — Assessment & Plan Note (Signed)
-   Outpatient follow-up °

## 2022-01-23 NOTE — H&P (Signed)
?History and Physical  ? ? ?Patient: Tamara Flores PXT:[id number] DOB: 09/17/1946 ?DOA: 01/23/2022 ?DOS: the patient was seen and examined on 01/23/2022 ?PCP: Unk Pinto, MD  ?Patient coming from: Home ? ?Chief Complaint:  ?Chief Complaint  ?Patient presents with  ? Shortness of Breath  ? ?HPI: Tamara Flores is a 76 y.o. female with medical history significant of hypertension, tobacco use (trying to cut down on cigarettes), hyperlipidemia currently not on medications, obesity presented with cough worsening shortness of breath.  Patient does not have a formal diagnosis of COPD and does not use oxygen at home.  She has been having worsening cough with whitish/greenish sputum for the last 5 to 7 days along with progressive exertional dyspnea, fatigue.  She was seen at urgent care few days ago and prescribed oral amoxicillin and lozenges.  Patient denied any chest pain, nausea, vomiting, abdominal pain, diarrhea or dysuria.  She is currently staying with a friend in town who is also sick with similar symptoms.  Patient denies any loss of consciousness, seizures. ? ?ED course: ?She was found to be hypoxic requiring supplemental oxygen.  COVID-19 and influenza testing were negative.  She was given neb treatments.  Chest x-ray showed?  Left lateral base opacity.  Patient refused steroids treatment.  Hospitalist service was called to evaluate the patient. ? ?Review of Systems: As mentioned in the history of present illness. All other systems reviewed and are negative. ?Past Medical History:  ?Diagnosis Date  ? Anemia   ? Hyperlipidemia   ? Hypertension   ? ?History reviewed. No pertinent surgical history. ?Social History:  reports that she has been smoking cigarettes. She has been smoking an average of .5 packs per day. She does not have any smokeless tobacco history on file. She reports that she does not drink alcohol. No history on file for drug use. ? ?Allergies  ?Allergen Reactions  ? Azithromycin   ?  GI upset  ? Crestor  [Rosuvastatin]   ?  GI upset  ? Klonopin [Clonazepam]   ? Lexapro [Escitalopram Oxalate]   ? Lisinopril   ? Lopid [Gemfibrozil]   ? Losartan   ? Magnesium-Containing Compounds   ?  GI upset  ? Prednisone   ? Sulfa Antibiotics   ? Vytorin [Ezetimibe-Simvastatin]   ? Zoloft [Sertraline Hcl]   ? ? ?No family history on file. ? ?Prior to Admission medications   ?Medication Sig Start Date End Date Taking? Authorizing Provider  ?Cholecalciferol (VITAMIN D) 2000 UNITS tablet Take 6,000 Units by mouth daily.   Yes [provider]  ?fluticasone-salmeterol (ADVAIR) 250-50 MCG/ACT AEPB Inhale 1 puff into the lungs 2 (two) times daily. 01/20/22  Yes [provider]  ?latanoprost (XALATAN) 0.005 % ophthalmic solution Place 1 drop into both eyes every evening. 12/18/21  Yes [provider]  ?losartan (COZAAR) 100 MG tablet Take 100 mg by mouth daily.   Yes [provider]  ?omeprazole (PRILOSEC) 20 MG capsule Take 20 mg by mouth daily.   Yes [provider]  ?propranolol ER (INDERAL LA) 80 MG 24 hr capsule Take 80 mg by mouth at bedtime. 10/30/21  Yes [provider]  ?Albuterol Sulfate (PROAIR RESPICLICK) 627 (90 BASE) MCG/ACT AEPB Inhale 2 Act into the lungs every 6 (six) hours as needed (for shortness of breath). 03/06/15   Vladimir Crofts, PA-C  ?BENICAR 40 MG tablet TAKE 1 TABLET BY MOUTH EVERY DAY ?Patient not taking: Reported on 01/23/2022 08/04/16   Rolene Course, PA-C  ?  BYSTOLIC 20 MG TABS TAKE 1 TABLET BY MOUTH DAILY ?Patient not taking: Reported on 01/23/2022 06/10/16   Unk Pinto, MD  ?diazepam (VALIUM) 2 MG tablet TAKE 1/2 TO 1 TABLET 3 TIMES A DAY AS NEEDED FOR ANXIETY ?Patient not taking: Reported on 01/23/2022 01/03/16   Unk Pinto, MD  ?Fluticasone Furoate-Vilanterol (BREO ELLIPTA) 100-25 MCG/INH AEPB Inhale 1 puff into the lungs daily. Make sure you rinse your mouth out after each use. ?Patient not taking: Reported on 01/23/2022 03/06/15   Vladimir Crofts,  PA-C  ?hydrochlorothiazide (HYDRODIURIL) 12.5 MG tablet Take as needed for leg swelling ?Patient not taking: Reported on 01/23/2022 03/30/15   Rolene Course, PA-C  ?Pitavastatin Calcium (LIVALO) 2 MG TABS Take 1 tablet (2 mg total) by mouth daily. ?Patient not taking: Reported on 01/23/2022 06/15/14   Unk Pinto, MD  ? ? ?Physical Exam: ?Vitals:  ? 01/23/22 1300 01/23/22 1304 01/23/22 1430 01/23/22 1558  ?BP: (!) 137/57  121/71 (!) 177/89  ?Pulse: 79 75 62 72  ?Resp: '18 18 16   '$ ?Temp:    98.9 ?F (37.2 ?C)  ?TempSrc:    Oral  ?SpO2: 96% 94% 95% 93%  ?Weight:      ?Height:      ? ?General: No acute distress, currently on 3 L oxygen via nasal cannula.  ENT/neck: No elevated JVD.  No obvious masses  ?respiratory: Bilateral decreased breath sounds at bases with some scattered crackles, some wheezing ?CVS: S1-S2 heard, rate controlled ?Abdominal: Soft, obese, nontender, nondistended, no organomegaly, bowel sounds heard ?Extremities: No cyanosis, clubbing; trace lower extremity edema CNS: Alert, awake and oriented.  No focal neurologic deficit.  Moving extremities. ?Lymph: No cervical lymphadenopathy ?Skin: No rashes, lesions, ulcers ?Psych: Intermittently gets anxious.  Currently not agitated.   ?Musculoskeletal: No obvious joint deformity/tenderness/swelling ? ?Data Reviewed: ?I have myself reviewed patient's investigations including imaging and blood work myself.  Chest x-ray showed?  Left lateral base opacity.  Sodium 144, potassium 3.6, BUN 15, creatinine 0.84.  Venous blood gas showed PCO2 of 63.6, PO2 of 64.  Influenza and COVID-19 test negative. ? ?Assessment and Plan: ?* Acute respiratory failure with hypoxia and hypercapnia (HCC) ?COPD with exacerbation ?History of tobacco abuse ?-Patient normally does not wear oxygen at home and is trying to cut down on her smoking recently. ?-Hesitant to use hypertensive steroids because of side effects of not feeling well.  Agreeable to Decadron use.  Start Decadron 6 mg IV  daily.  Start [place].  Continue nebs. ?-Start Zithromax. ?-COVID-19 and influenza testing negative ?-Chest x-ray showed left lateral base opacity, atelectasis/infiltrate or mass.  Check procalcitonin in AM.  Will need repeat chest x-ray as an outpatient and possible CT chest.  Will need outpatient evaluation by pulmonary ?-Currently on 3 L oxygen via nasal cannula.  Wean off as able. ? ?Hypertension ?- Continue home regimen.  Monitor blood pressure. ? ?Tobacco use disorder ?- Patient is trying to cut down.  Counseled regarding cessation. ? ?Obesity ?- Outpatient follow-up ? ? ? ? ? Advance Care Planning:   Code Status: Full Code  ? ?Consults: None ? ?Family Communication: None ? ?Severity of Illness: ?The appropriate patient status for this patient is OBSERVATION. Observation status is judged to be reasonable and necessary in order to provide the required intensity of service to ensure the patient's safety. The patient's presenting symptoms, physical exam findings, and initial radiographic and laboratory data in the context of their medical condition is felt to place them at decreased  risk for further clinical deterioration. Furthermore, it is anticipated that the patient will be medically stable for discharge from the hospital within 2 midnights of admission.  ? ?Author: ?Aline August, MD ?01/23/2022 4:38 PM ? ?For on call review [email or web address].  ?

## 2022-01-23 NOTE — ED Notes (Signed)
Patient administered duoneb on RA; patient O2 dropped to 82-83% during treatment. Patient with frequent coughing fits. Patient replaced on 2 L  and O2 sats 90-95%. Patient additionally instructed on the use of MDI w/ spacer and flutter device. Patient demonstrated good effort with all therapies. Patient currently 95% on 2 L. RT will continue to monitor patient throughout encounter.  ?

## 2022-01-23 NOTE — ED Notes (Signed)
Patient report given to [facility] RT. ?

## 2022-01-23 NOTE — Assessment & Plan Note (Addendum)
-   Continue home regimen.  Outpatient follow-up ?

## 2022-01-23 NOTE — ED Provider Notes (Signed)
MEDCENTER [facility] EMERGENCY DEPT Provider Note  CSN: [id number] Arrival date & time: 01/23/22 0505  Chief Complaint(s) Shortness of Breath  HPI Tamara Flores is a 76 y.o. female with past medical history listed below including hypertension, hyperlipidemia and longtime smoker without diagnosis of COPD here for shortness of breath mostly after severe coughing spell.  Reports that she feels like her lungs tightening up and her airway is clogged.  This lasted for several minutes and she feels like her lungs open up.  No associated chest pain.  No nausea or vomiting.  Patient is currently staying with a friend in town who also got sick at the same time.  They went to urgent care and were negative for COVID/influenza.  You are being treated for bronchitis and currently on amoxicillin.  Patient also given steroid inhaler.  On review of records, CT from outside hospital in October 2021 showed presence of emphysema.   Shortness of Breath  Past Medical History Past Medical History:  Diagnosis Date   Anemia    Hyperlipidemia    Hypertension    Patient Active Problem List   Diagnosis Date Noted   Encounter for Medicare annual wellness exam 08/22/2015   Abdominal obesity-metabolic syndrome type 3 (HCC) 08/22/2015   Obesity 09/26/2014   Tobacco use disorder 09/26/2014   Prediabetes 06/15/2014   Vitamin D deficiency 06/15/2014   Medication management 06/15/2014   Hypertension    Hyperlipidemia    Anemia    Home Medication(s) Prior to Admission medications   Medication Sig Start Date End Date Taking? Authorizing Provider  Albuterol Sulfate (PROAIR RESPICLICK) 108 (90 BASE) MCG/ACT AEPB Inhale 2 Act into the lungs every 6 (six) hours as needed (for shortness of breath). 03/06/15   Doree Albee, PA-C  aspirin 81 MG tablet Take 81 mg by mouth daily.    [provider]  BENICAR 40 MG tablet TAKE 1 TABLET BY MOUTH EVERY DAY 08/04/16   Shirleen Schirmer, PA-C  BYSTOLIC 20 MG TABS  TAKE 1 TABLET BY MOUTH DAILY 06/10/16   Lucky Cowboy, MD  Cholecalciferol (VITAMIN D) 2000 UNITS tablet Take 6,000 Units by mouth daily.    [provider]  diazepam (VALIUM) 2 MG tablet TAKE 1/2 TO 1 TABLET 3 TIMES A DAY AS NEEDED FOR ANXIETY 01/03/16   Lucky Cowboy, MD  Fluticasone Furoate-Vilanterol (BREO ELLIPTA) 100-25 MCG/INH AEPB Inhale 1 puff into the lungs daily. Make sure you rinse your mouth out after each use. 03/06/15   Doree Albee, PA-C  hydrochlorothiazide (HYDRODIURIL) 12.5 MG tablet Take as needed for leg swelling 03/30/15   Shirleen Schirmer, PA-C  omeprazole (PRILOSEC) 20 MG capsule Take 20 mg by mouth daily.    [provider]  OVER THE COUNTER MEDICATION Calcium with magnesium and zinc    [provider]  Pitavastatin Calcium (LIVALO) 2 MG TABS Take 1 tablet (2 mg total) by mouth daily. 06/15/14   Lucky Cowboy, MD  rosuvastatin (CRESTOR) 20 MG tablet Take 1 tablet (20 mg total) by mouth at bedtime. 08/22/15 08/21/16  Doree Albee, PA-C  Allergies Azithromycin, Crestor [rosuvastatin], Klonopin [clonazepam], Lexapro [escitalopram oxalate], Lisinopril, Lopid [gemfibrozil], Losartan, Magnesium-containing compounds, Prednisone, Sulfa antibiotics, Vytorin [ezetimibe-simvastatin], and Zoloft [sertraline hcl]  Review of Systems Review of Systems  Respiratory:  Positive for shortness of breath.   As noted in HPI  Physical Exam Vital Signs  I have reviewed the triage vital signs BP (!) 185/89   Pulse 64   Temp 98.4 F (36.9 C)   Resp 11   Ht 5\' 5"  (1.651 m)   Wt 92.1 kg   SpO2 94%   BMI 33.78 kg/m   Physical Exam Vitals reviewed.  Constitutional:      General: She is not in acute distress.    Appearance: She is well-developed. She is not diaphoretic.  HENT:     Head: Normocephalic and atraumatic.      Nose: Nose normal.  Eyes:     General: No scleral icterus.       Right eye: No discharge.        Left eye: No discharge.     Conjunctiva/sclera: Conjunctivae normal.     Pupils: Pupils are equal, round, and reactive to light.  Cardiovascular:     Rate and Rhythm: Normal rate and regular rhythm.     Heart sounds: No murmur heard.   No friction rub. No gallop.  Pulmonary:     Effort: Pulmonary effort is normal. Tachypnea present. No respiratory distress.     Breath sounds: Normal breath sounds. Decreased air movement present. No stridor. No wheezing, rhonchi or rales.     Comments: Horsed voice Abdominal:     General: There is no distension.     Palpations: Abdomen is soft.     Tenderness: There is no abdominal tenderness.  Musculoskeletal:        General: No tenderness.     Cervical back: Normal range of motion and neck supple.     Right lower leg: 1+ Pitting Edema present.     Left lower leg: 1+ Pitting Edema present.  Skin:    General: Skin is warm and dry.     Findings: No erythema or rash.  Neurological:     Mental Status: She is alert and oriented to person, place, and time.    ED Results and Treatments Labs (all labs ordered are listed, but only abnormal results are displayed) Labs Reviewed  COMPREHENSIVE METABOLIC PANEL - Abnormal; Notable for the following components:      Result Value   CO2 33 (*)    Glucose, Bld 116 (*)    AST 10 (*)    All other components within normal limits  I-STAT VENOUS BLOOD GAS, ED - Abnormal; Notable for the following components:   pCO2, Ven 63.6 (*)    pO2, Ven 64 (*)    Bicarbonate 37.7 (*)    TCO2 40 (*)    Acid-Base Excess 10.0 (*)    All other components within normal limits  CBC WITH DIFFERENTIAL/PLATELET  BRAIN NATRIURETIC PEPTIDE  BLOOD GAS, VENOUS  TROPONIN I (HIGH SENSITIVITY)  EKG  EKG  Interpretation  Date/Time:  Wednesday January 23 2022 05:22:24 EST Ventricular Rate:  79 PR Interval:  150 QRS Duration: 86 QT Interval:  406 QTC Calculation: 466 R Axis:   47 Text Interpretation: Sinus rhythm Confirmed by Drema Pry [phone]) on 01/23/2022 6:51:40 AM       Radiology DG Chest Port 1 View  Result Date: 01/23/2022 CLINICAL DATA:  Coughing and shortness of breath. EXAM: PORTABLE CHEST 1 VIEW COMPARISON:  PA Lat 12/19/2009. FINDINGS: The lungs demonstrate mild emphysematous and chronic interstitial changes without evidence of focal pneumonia. There is increased opacity in the lateral left base which could be atelectasis or small pneumonia, underlying mass not strictly excluded. Consider CT follow-up. The aorta is tortuous with patchy calcifications, stable mediastinum with normal heart size. There is a moderate-sized hiatal hernia. The thoracic cage grossly intact. IMPRESSION: 1. COPD with increased left lateral basal opacity which could be atelectasis, a small infiltrate or mass. Chest CT recommended preferably with contrast. 2. Aortic atherosclerosis. Electronically Signed   By: Almira Bar M.D.   On: 01/23/2022 06:14    Pertinent labs & imaging results that were available during my care of the patient were reviewed by me and considered in my medical decision making (see MDM for details).  Medications Ordered in ED Medications  albuterol (VENTOLIN HFA) 108 (90 Base) MCG/ACT inhaler 4 puff (2 puffs Inhalation Not Given 01/23/22 0705)  ipratropium-albuterol (DUONEB) 0.5-2.5 (3) MG/3ML nebulizer solution 3 mL (has no administration in time range)  magnesium sulfate IVPB 2 g 50 mL (has no administration in time range)  albuterol (VENTOLIN HFA) 108 (90 Base) MCG/ACT inhaler 2 puff (has no administration in time range)  ipratropium-albuterol (DUONEB) 0.5-2.5 (3) MG/3ML nebulizer solution 3 mL (3 mLs Nebulization Given 01/23/22 0645)  AeroChamber Plus Flo-Vu Large MISC 1 each (1 each  Other Given 01/23/22 0705)                                                                                                                                     Procedures .Critical Care Performed by: Nira Conn, MD Authorized by: Nira Conn, MD   Critical care provider statement:    Critical care time (minutes):  45   Critical care time was exclusive of:  Separately billable procedures and treating other patients   Critical care was necessary to treat or prevent imminent or life-threatening deterioration of the following conditions:  Respiratory failure   Critical care was time spent personally by me on the following activities:  Development of treatment plan with patient or surrogate, discussions with consultants, evaluation of patient's response to treatment, examination of patient, obtaining history from patient or surrogate, review of old charts, re-evaluation of patient's condition, pulse oximetry, ordering and review of radiographic studies, ordering and review of laboratory studies and ordering and performing treatments and interventions  (including critical care time)  Medical Decision Making /  ED Course    Complexity of Problem:  Co-morbidities/SDOH that complicate the patient evaluation/care: Longtime smoker with likely undiagnosed COPD  Additional history obtained: N/A  Patient's presenting problem/concern and DDX listed below: Shortness of breath Feels most consistent with bronchitis with likely COPD exacerbation. We will assess for evidence of pneumonia, pneumothorax.  Peripheral edema is likely dependent but will assess for evidence of heart failure.     Complexity of Data:   Cardiac Monitoring: The patient was maintained on a cardiac monitor.   I personally viewed and interpreted the cardiac monitored which showed an underlying rhythm of normal sinus rhythm and rates in the 70s to 80s.  No dysrhythmias or blocks. EKG without acute ischemic  changes or evidence of pericarditis.  No dysrhythmias or blocks.  Laboratory Tests ordered listed below with my independent interpretation: CBC without leukocytosis or anemia No significant electrolyte derangements or renal sufficiency. VBG notable for compensated respiratory acidosis. BnP and troponin negative   Imaging Studies ordered listed below with my independent interpretation: On my evaluation there is no evidence of pulmonary edema, pneumonia or pneumothorax Radiology noted a small infiltrate in the left base concerning for atelectasis versus pneumonia.  Given the poor air movement, lack of fever, and leukocytosis -feel pneumonia is less likely.  Regardless patient is already on antibiotics     ED Course:    Hospitalization Considered:  Yes if patient does not respond to breathing treatment  Assessment, Intervention, and Reassessment: Shortness of breath Most consistent with bronchitis/COPD exacerbation Patient is not dependent on supplemental oxygen but currently requires 2 L nasal cannula. Possible pneumonia on radiology read but I feel this is less likely. Regardless patient is currently on appropriate antibiotics.  We will provide patient with breathing treatment. Patient declined steroids due to intolerance. Plan to reassess.  Still dropping sats after couging. Will get additional treatment, magnesium, and flutter valve. Low threshold for admission. Patient care turned over to oncoming provider. Patient case and results discussed in detail; please see their note for further ED managment.   Final Clinical Impression(s) / ED Diagnoses Final diagnoses:  SOB (shortness of breath)           This chart was dictated using voice recognition software.  Despite best efforts to proofread,  errors can occur which can change the documentation meaning.    Nira Conn, MD 01/23/22 [phone]

## 2022-01-23 NOTE — ED Notes (Signed)
Pt in bed, pt offers no complaints at this time.  Pt has no requests at this time.   ?

## 2022-01-23 NOTE — Assessment & Plan Note (Signed)
-   Patient is trying to cut down.  Counseled regarding cessation. ?

## 2022-01-23 NOTE — ED Provider Notes (Signed)
Care transferred to me.  Patient is breathing a little easier but still frequently drops her O2 sats.  When respiratory therapy was in there she went down to 82%.  She is requiring 2 L oxygen.  She does not have a formal COPD diagnosis but it is expected based on previous flares similar to this and current smoking.  She is still adamant she does not want steroids because of how bad they make her feel, even at minimal dosing.  We will hold off on this for now.  She will be admitted.  Discussed with Dr. Olevia Bowens, who will admit. ?  ?Sherwood Gambler, MD ?01/23/22 0831 ? ?

## 2022-01-23 NOTE — ED Notes (Signed)
Patient's nebulizer treatment ran on room air. SpO2 decreased from 93% on room air to 89% on room air while on breathing treatment. Patient returned to [facility], SpO2 93% on [place]. ?

## 2022-01-23 NOTE — Plan of Care (Signed)
?  Problem: Activity: ?Goal: Ability to tolerate increased activity will improve ?Outcome: Progressing ?  ?Problem: Respiratory: ?Goal: Levels of oxygenation will improve ?Outcome: Progressing ?  ?

## 2022-01-24 DIAGNOSIS — J441 Chronic obstructive pulmonary disease with (acute) exacerbation: Secondary | ICD-10-CM | POA: Diagnosis not present

## 2022-01-24 DIAGNOSIS — E669 Obesity, unspecified: Secondary | ICD-10-CM | POA: Diagnosis present

## 2022-01-24 DIAGNOSIS — Z888 Allergy status to other drugs, medicaments and biological substances status: Secondary | ICD-10-CM | POA: Diagnosis not present

## 2022-01-24 DIAGNOSIS — I10 Essential (primary) hypertension: Secondary | ICD-10-CM | POA: Diagnosis present

## 2022-01-24 DIAGNOSIS — E785 Hyperlipidemia, unspecified: Secondary | ICD-10-CM | POA: Diagnosis present

## 2022-01-24 DIAGNOSIS — Z881 Allergy status to other antibiotic agents status: Secondary | ICD-10-CM | POA: Diagnosis not present

## 2022-01-24 DIAGNOSIS — Z20822 Contact with and (suspected) exposure to covid-19: Secondary | ICD-10-CM | POA: Diagnosis present

## 2022-01-24 DIAGNOSIS — K5900 Constipation, unspecified: Secondary | ICD-10-CM | POA: Diagnosis present

## 2022-01-24 DIAGNOSIS — J439 Emphysema, unspecified: Secondary | ICD-10-CM | POA: Diagnosis present

## 2022-01-24 DIAGNOSIS — Z882 Allergy status to sulfonamides status: Secondary | ICD-10-CM | POA: Diagnosis not present

## 2022-01-24 DIAGNOSIS — Z79899 Other long term (current) drug therapy: Secondary | ICD-10-CM | POA: Diagnosis not present

## 2022-01-24 DIAGNOSIS — J9601 Acute respiratory failure with hypoxia: Secondary | ICD-10-CM | POA: Diagnosis present

## 2022-01-24 DIAGNOSIS — R0602 Shortness of breath: Secondary | ICD-10-CM | POA: Diagnosis present

## 2022-01-24 DIAGNOSIS — Z6833 Body mass index (BMI) 33.0-33.9, adult: Secondary | ICD-10-CM | POA: Diagnosis not present

## 2022-01-24 DIAGNOSIS — F17210 Nicotine dependence, cigarettes, uncomplicated: Secondary | ICD-10-CM | POA: Diagnosis present

## 2022-01-24 DIAGNOSIS — J9602 Acute respiratory failure with hypercapnia: Secondary | ICD-10-CM | POA: Diagnosis present

## 2022-01-24 DIAGNOSIS — Z7951 Long term (current) use of inhaled steroids: Secondary | ICD-10-CM | POA: Diagnosis not present

## 2022-01-24 LAB — BASIC METABOLIC PANEL
Anion gap: 5 (ref 5–15)
BUN: 17 mg/dL (ref 8–23)
CO2: 33 mmol/L — ABNORMAL HIGH (ref 22–32)
Calcium: 8.8 mg/dL — ABNORMAL LOW (ref 8.9–10.3)
Chloride: 101 mmol/L (ref 98–111)
Creatinine, Ser: 0.62 mg/dL (ref 0.44–1.00)
GFR, Estimated: >60 mL/min (ref >60)
Glucose, Bld: 141 mg/dL — ABNORMAL HIGH (ref 70–99)
Potassium: 4 mmol/L (ref 3.5–5.1)
Sodium: 139 mmol/L (ref 135–145)

## 2022-01-24 LAB — MAGNESIUM: Magnesium: 1.6 mg/dL — ABNORMAL LOW (ref 1.7–2.4)

## 2022-01-24 MED ORDER — POLYETHYLENE GLYCOL 3350 17 G PO PACK
17.0000 g | PACK | Freq: Every day | ORAL | Status: DC | PRN
Start: 2022-01-24 — End: 2022-01-25
  Administered 2022-01-24: 08:00:00 17 g via ORAL
  Filled 2022-01-24: qty 1

## 2022-01-24 MED ORDER — BISACODYL 10 MG RE SUPP: 10.0000 mg | Freq: Every day | RECTAL | Status: DC | PRN

## 2022-01-24 MED ORDER — SENNOSIDES-DOCUSATE SODIUM 8.6-50 MG PO TABS
1.0000 | ORAL_TABLET | Freq: Two times a day (BID) | ORAL | Status: DC
  Administered 2022-01-24: 10:00:00 1 via ORAL
  Filled 2022-01-24 (×3): qty 1

## 2022-01-24 MED ORDER — FLUTICASONE PROPIONATE 50 MCG/ACT NA SUSP
1.0000 | Freq: Two times a day (BID) | NASAL | Status: DC | PRN
  Administered 2022-01-24: 10:00:00 1 via NASAL
  Filled 2022-01-24: qty 16

## 2022-01-24 MED ORDER — MAGNESIUM SULFATE 2 GM/50ML IV SOLN
2.0000 g | Freq: Once | INTRAVENOUS | Status: AC
  Administered 2022-01-24: 09:00:00 2 g via INTRAVENOUS
  Filled 2022-01-24: qty 50

## 2022-01-24 NOTE — Assessment & Plan Note (Addendum)
Resolved

## 2022-01-24 NOTE — Progress Notes (Addendum)
?  Progress Note ? ? ?Patient: Tamara Flores WHQ:[id number] DOB: [birth year] DOA: 01/23/2022     0 ?DOS: the patient was seen and examined on 01/24/2022 ?  ?Brief hospital course: ?76 y.o. female with medical history significant of hypertension, tobacco use (trying to cut down on cigarettes), hyperlipidemia currently not on medications, obesity presented with cough and worsening shortness of breath.  On presentation, she was found to be hypoxic requiring supplemental oxygen. Chest x-ray showed?  Left lateral base opacity.  She was started on IV Decadron and nebulizers/inhalers ? ?Assessment and Plan: ?Acute respiratory failure with hypoxia and hypercapnia (HCC) ?COPD with exacerbation ?History of tobacco abuse ?-Patient normally does not wear oxygen at home and is trying to cut down on her smoking recently. ?-Continue IV Decadron.  Continue Dulera and nebs. ?-continue Zithromax.   ?-COVID-19 and influenza testing negative ?-Chest x-ray showed left lateral base opacity, atelectasis/infiltrate or mass.  Check procalcitonin in AM.  Will need repeat chest x-ray as an outpatient and possible CT chest.  Will need outpatient evaluation by pulmonary ?-Respiratory status improving.  Currently on room air. ? ?Hypertension ?- Continue home regimen.  Monitor blood pressure. ? ?Obesity ?- Outpatient follow-up ? ?Tobacco use disorder ?- Patient is trying to cut down.  Counseled regarding cessation. ? ?Hypomagnesemia ?- Replace.  Repeat a.m. labs ? ? ?  ? ?Subjective:  ?Patient seen and examined at bedside.  Feels that her breathing is improving but still coughing intermittently and not ready to go home yet.  Feels slightly constipated. ? ?Physical Exam: ?Vitals:  ? 01/23/22 2025 01/24/22 0032 01/24/22 0448 01/24/22 6599  ?BP: (!) 174/88 (!) 171/75 (!) 152/80   ?Pulse: 78 75 72   ?Resp: '20 18 18   '$ ?Temp: 98.8 ?F (37.1 ?C) 98.6 ?F (37 ?C) 98.1 ?F (36.7 ?C)   ?TempSrc: Oral Oral Oral   ?SpO2: 96% 94% 95% 90%  ?Weight:      ?Height:       ? ?General: On room air.  No distress ?ENT/neck: No thyromegaly.  JVD is not elevated  ?respiratory: Decreased breath sounds at bases bilaterally with some crackles; no wheezing CVS: Rate controlled currently; S1-S2 heard  ?abdominal: Soft, obese, nontender, slightly distended; no organomegaly, normal bowel sounds are heard ?Extremities: Trace lower extremity edema; no cyanosis  ?CNS: Awake and alert.  No focal neurologic deficit.  Moves extremities ?Lymph: No obvious lymphadenopathy ?Skin: No obvious ecchymosis/lesions  ?psych: Affect, judgment and mood are normal  ?musculoskeletal: No obvious joint swelling/deformity ? ? ?Data Reviewed: ?I have myself reviewed patient's investigations including imaging and blood work myself.  Today sodium is 139, potassium 4, magnesium 1.6, creatinine 0.62 ? ?Family Communication: None at bedside ? ?Disposition: ?Status is: Observation ?The patient will require care spanning > 2 midnights and should be moved to inpatient because: Of severity of illness.  Need for IV steroids. ? Planned Discharge Destination: Home ? ? ? ? ?Author: ?Aline August, MD ?01/24/2022 10:33 AM ? ?For on call review [email or web address].  ?

## 2022-01-24 NOTE — Hospital Course (Addendum)
76 y.o. female with medical history significant of hypertension, tobacco use (trying to cut down on cigarettes), hyperlipidemia currently not on medications, obesity presented with cough and worsening shortness of breath.  On presentation, she was found to be hypoxic requiring supplemental oxygen. Chest x-ray showed?  Left lateral base opacity.  She was started on IV Decadron and nebulizers/inhalers.  During the hospitalization, her condition has improved.  She is currently on room air.  She will be discharged home on oral Decadron with outpatient follow-up with PCP.  She will need outpatient evaluation and follow-up by pulmonary. ?

## 2022-01-25 LAB — BASIC METABOLIC PANEL
Anion gap: 6 (ref 5–15)
BUN: 19 mg/dL (ref 8–23)
CO2: 31 mmol/L (ref 22–32)
Calcium: 8.4 mg/dL — ABNORMAL LOW (ref 8.9–10.3)
Chloride: 102 mmol/L (ref 98–111)
Creatinine, Ser: 0.86 mg/dL (ref 0.44–1.00)
GFR, Estimated: >60 mL/min (ref >60)
Glucose, Bld: 130 mg/dL — ABNORMAL HIGH (ref 70–99)
Potassium: 4.1 mmol/L (ref 3.5–5.1)
Sodium: 139 mmol/L (ref 135–145)

## 2022-01-25 LAB — MAGNESIUM: Magnesium: 2 mg/dL (ref 1.7–2.4)

## 2022-01-25 LAB — PROCALCITONIN: Procalcitonin: <0.1 ng/mL

## 2022-01-25 MED ORDER — DM-GUAIFENESIN ER 30-600 MG PO TB12: 2.0000 | ORAL_TABLET | Freq: Two times a day (BID) | ORAL | 0 refills | Status: AC

## 2022-01-25 MED ORDER — HYDRALAZINE HCL 25 MG PO TABS
25.0000 mg | ORAL_TABLET | Freq: Once | ORAL | Status: AC
  Administered 2022-01-25: 25 mg via ORAL
  Filled 2022-01-25: qty 1

## 2022-01-25 MED ORDER — ALBUTEROL SULFATE HFA 108 (90 BASE) MCG/ACT IN AERS: 2.0000 | INHALATION_SPRAY | RESPIRATORY_TRACT | 0 refills | Status: AC | PRN

## 2022-01-25 MED ORDER — DEXAMETHASONE 6 MG PO TABS: 6.0000 mg | ORAL_TABLET | Freq: Every day | ORAL | 0 refills | Status: AC

## 2022-01-25 NOTE — TOC Progression Note (Signed)
Transition of Care (TOC) - Progression Note  ? ? ?Patient Details  ?Name: Tamara Flores ?MRN: [id number] ?Date of Birth: 22-May-1946 ? ?Transition of Care (TOC) CM/SW Contact  ?Purcell Mouton, RN ?Phone Number: ?01/25/2022, 10:38 AM ? ?Clinical Narrative:    ? ? ?Transition of Care (TOC) Screening Note ? ? ?Patient Details  ?Name: Tamara Flores ?Date of Birth: 07/27/1946 ? ? ?Transition of Care (TOC) CM/SW Contact:    ?Purcell Mouton, RN ?Phone Number: ?01/25/2022, 10:38 AM ? ? ? ?Transition of Care Department [facility]) has reviewed patient and no TOC needs have been identified at this time. We will continue to monitor patient advancement through interdisciplinary progression rounds. If new patient transition needs arise, please place a TOC consult. ?  ? ?  ?  ? ?Expected Discharge Plan and Services ?  ?  ?  ?  ?  ?Expected Discharge Date: 01/25/22               ?  ?  ?  ?  ?  ?  ?  ?  ?  ?  ? ? ?Social Determinants of Health (SDOH) Interventions ?  ? ?Readmission Risk Interventions ?No flowsheet data found. ? ?

## 2022-01-25 NOTE — Discharge Summary (Signed)
Physician Discharge Summary   Patient: Tamara Flores MRN: [id number] DOB: 1946-04-16  Admit date:     01/23/2022  Discharge date: 01/25/22  Discharge Physician: Aline August   PCP: No primary care provider on file.   Recommendations at discharge:   Follow-up with PCP within a week Outpatient evaluation and follow-up by pulmonary Follow-up in the ED if symptoms worsen or new appear   Hospital Course: 76 y.o. female with medical history significant of hypertension, tobacco use (trying to cut down on cigarettes), hyperlipidemia currently not on medications, obesity presented with cough and worsening shortness of breath.  On presentation, she was found to be hypoxic requiring supplemental oxygen. Chest x-ray showed?  Left lateral base opacity.  She was started on IV Decadron and nebulizers/inhalers.  During the hospitalization, her condition has improved.  She is currently on room air.  She will be discharged home on oral Decadron with outpatient follow-up with PCP.  She will need outpatient evaluation and follow-up by pulmonary.  Discharge diagnosis and assessment and Plan: * Acute respiratory failure with hypoxia and hypercapnia (HCC) COPD with exacerbation History of tobacco abuse -Patient normally does not wear oxygen at home and is trying to cut down on her smoking recently. -Treated with IV Decadron along with Dulera and nebs. -COVID-19 and influenza testing negative -Chest x-ray showed left lateral base opacity, atelectasis/infiltrate or mass.  Procalcitonin less than 0.1.  Will need repeat chest x-ray as an outpatient and possible CT chest.  Will need outpatient evaluation by pulmonary -Respiratory status improving.  Currently on room air. -Feels much better.  She will be discharged home today on Decadron 6 mg daily for a week.  Continue home inhaled regimen.  Hypertension - Continue home regimen.  Outpatient follow-up  Obesity - Outpatient follow-up  Tobacco use disorder -  Patient is trying to cut down.  Counseled regarding cessation.  Hypomagnesemia - Resolved         Consultants: None Procedures performed: None Disposition: Home Diet recommendation:  Discharge Diet Orders (From admission, onward)     Start     Ordered   01/25/22 0000  Diet - low sodium heart healthy        01/25/22 0921           Cardiac diet DISCHARGE MEDICATION: Allergies as of 01/25/2022       Reactions   Azithromycin    GI upset   Crestor [rosuvastatin]    GI upset   Klonopin [clonazepam]    Lexapro [escitalopram Oxalate]    Lisinopril    Lopid [gemfibrozil]    Losartan    Magnesium-containing Compounds    GI upset   Prednisone    Sulfa Antibiotics    Vytorin [ezetimibe-simvastatin]    Zoloft [sertraline Hcl]         Medication List     STOP taking these medications    Albuterol Sulfate 108 (90 Base) MCG/ACT Aepb Commonly known as: ProAir RespiClick Replaced by: albuterol 108 (90 Base) MCG/ACT inhaler   Benicar 40 MG tablet Generic drug: olmesartan   Bystolic 20 MG Tabs Generic drug: Nebivolol HCl   diazepam 2 MG tablet Commonly known as: VALIUM   fluticasone furoate-vilanterol 100-25 MCG/INH Aepb Commonly known as: Breo Ellipta   hydrochlorothiazide 12.5 MG tablet Commonly known as: HYDRODIURIL   Pitavastatin Calcium 2 MG Tabs Commonly known as: Livalo       TAKE these medications    albuterol 108 (90 Base) MCG/ACT inhaler Commonly known as: VENTOLIN HFA  Inhale 2 puffs into the lungs every 4 (four) hours as needed for wheezing or shortness of breath. Replaces: Albuterol Sulfate 108 (90 Base) MCG/ACT Aepb   dexamethasone 6 MG tablet Commonly known as: DECADRON Take 1 tablet (6 mg total) by mouth daily.   dextromethorphan-guaiFENesin 30-600 MG 12hr tablet Commonly known as: MUCINEX DM Take 2 tablets by mouth 2 (two) times daily.   fluticasone-salmeterol 250-50 MCG/ACT Aepb Commonly known as: ADVAIR Inhale 1 puff into  the lungs 2 (two) times daily.   latanoprost 0.005 % ophthalmic solution Commonly known as: XALATAN Place 1 drop into both eyes every evening.   losartan 100 MG tablet Commonly known as: COZAAR Take 100 mg by mouth daily.   omeprazole 20 MG capsule Commonly known as: PRILOSEC Take 20 mg by mouth daily.   propranolol ER 80 MG 24 hr capsule Commonly known as: INDERAL LA Take 80 mg by mouth at bedtime.   Vitamin D 50 MCG (2000 UT) tablet Take 6,000 Units by mouth daily.      Subjective: Patient seen and examined at bedside.  She feels much better and feels that she should be okay going home today.  Still has some intermittent cough and shortness of breath.  Denies worsening fever, vomiting, chest pain  Discharge Exam: Filed Weights   01/23/22 0509  Weight: 92.1 kg   General: On room air.  No distress.   respiratory: Decreased breath sounds at bases bilaterally with some scattered crackles CVS: Currently rate controlled; S1-S2 heard  abdominal: Soft, obese, nontender, slightly distended, no organomegaly, normal bowel sounds are heard  extremities: No cyanosis, clubbing; trace lower extremity edema  Condition at discharge: good  The results of significant diagnostics from this hospitalization (including imaging, microbiology, ancillary and laboratory) are listed below for reference.   Imaging Studies: DG Chest Port 1 View  Result Date: 01/23/2022 CLINICAL DATA:  Coughing and shortness of breath. EXAM: PORTABLE CHEST 1 VIEW COMPARISON:  PA Lat 12/19/2009. FINDINGS: The lungs demonstrate mild emphysematous and chronic interstitial changes without evidence of focal pneumonia. There is increased opacity in the lateral left base which could be atelectasis or small pneumonia, underlying mass not strictly excluded. Consider CT follow-up. The aorta is tortuous with patchy calcifications, stable mediastinum with normal heart size. There is a moderate-sized hiatal hernia. The thoracic  cage grossly intact. IMPRESSION: 1. COPD with increased left lateral basal opacity which could be atelectasis, a small infiltrate or mass. Chest CT recommended preferably with contrast. 2. Aortic atherosclerosis. Electronically Signed   By: Telford Nab M.D.   On: 01/23/2022 06:14    Microbiology: Results for orders placed or performed during the hospital encounter of 01/23/22  Resp Panel by RT-PCR (Flu A&B, Covid) Nasopharyngeal Swab     Status: None   Collection Time: 01/23/22  8:22 AM   Specimen: Nasopharyngeal Swab; Nasopharyngeal(NP) swabs in vial transport medium  Result Value Ref Range Status   SARS Coronavirus 2 by RT PCR NEGATIVE NEGATIVE Final    Comment: (NOTE) SARS-CoV-2 target nucleic acids are NOT DETECTED.  The SARS-CoV-2 RNA is generally detectable in upper respiratory specimens during the acute phase of infection. The lowest concentration of SARS-CoV-2 viral copies this assay can detect is 138 copies/mL. A negative result does not preclude SARS-Cov-2 infection and should not be used as the sole basis for treatment or other patient management decisions. A negative result may occur with  improper specimen collection/handling, submission of specimen other than nasopharyngeal swab, presence of viral mutation(s) within  the areas targeted by this assay, and inadequate number of viral copies(<138 copies/mL). A negative result must be combined with clinical observations, patient history, and epidemiological information. The expected result is Negative.  Fact Sheet for Patients:  EntrepreneurPulse.com.au  Fact Sheet for Healthcare Providers:  IncredibleEmployment.be  This test is no t yet approved or cleared by the [place] FDA and  has been authorized for detection and/or diagnosis of SARS-CoV-2 by FDA under an Emergency Use Authorization (EUA). This EUA will remain  in effect (meaning this test can be used) for the duration of  the COVID-19 declaration under Section 564(b)(1) of the Act, 21 U.S.C.section 360bbb-3(b)(1), unless the authorization is terminated  or revoked sooner.       Influenza A by PCR NEGATIVE NEGATIVE Final   Influenza B by PCR NEGATIVE NEGATIVE Final    Comment: (NOTE) The Xpert Xpress SARS-CoV-2/FLU/RSV plus assay is intended as an aid in the diagnosis of influenza from Nasopharyngeal swab specimens and should not be used as a sole basis for treatment. Nasal washings and aspirates are unacceptable for Xpert Xpress SARS-CoV-2/FLU/RSV testing.  Fact Sheet for Patients: EntrepreneurPulse.com.au  Fact Sheet for Healthcare Providers: IncredibleEmployment.be  This test is not yet approved or cleared by the [place] FDA and has been authorized for detection and/or diagnosis of SARS-CoV-2 by FDA under an Emergency Use Authorization (EUA). This EUA will remain in effect (meaning this test can be used) for the duration of the COVID-19 declaration under Section 564(b)(1) of the Act, 21 U.S.C. section 360bbb-3(b)(1), unless the authorization is terminated or revoked.  Performed at [facility], [address], [place], [place] [address]     Labs: CBC: Recent Labs  Lab 01/23/22 0529 01/23/22 0553  WBC 8.9  --   NEUTROABS 5.6  --   HGB 13.8 13.9  HCT 44.3 41.0  MCV 87.2  --   PLT 184  --    Basic Metabolic Panel: Recent Labs  Lab 01/23/22 0529 01/23/22 0553 01/24/22 0402 01/25/22 0401  NA 144 142 139 139  K 3.6 3.5 4.0 4.1  CL 101  --  101 102  CO2 33*  --  33* 31  GLUCOSE 116*  --  141* 130*  BUN 15  --  17 19  CREATININE 0.84  --  0.62 0.86  CALCIUM 9.1  --  8.8* 8.4*  MG  --   --  1.6* 2.0   Liver Function Tests: Recent Labs  Lab 01/23/22 0529  AST 10*  ALT 7  ALKPHOS 94  BILITOT 0.4  PROT 7.0  ALBUMIN 4.0   CBG: No results for input(s): GLUCAP in the last 168 hours.  Discharge time spent:  greater than 30 minutes.  Signed: Aline August, MD [facility] 01/25/2022

## 2022-01-25 NOTE — Progress Notes (Addendum)
AVS given to patient and explained at the bedside. Medications and follow up appointments have been explained with pt verbalizing understanding. One time dose of hydralazine given before discharge.  ? ?

## 2023-03-18 IMAGING — DX DG CHEST 1V PORT
1 series · 1 of 1 positions shown · non-contrast
Comparison: PA Lat 12/19/2009.

CLINICAL DATA: Coughing and shortness of breath.

EXAM:
PORTABLE CHEST 1 VIEW

[chest ap]
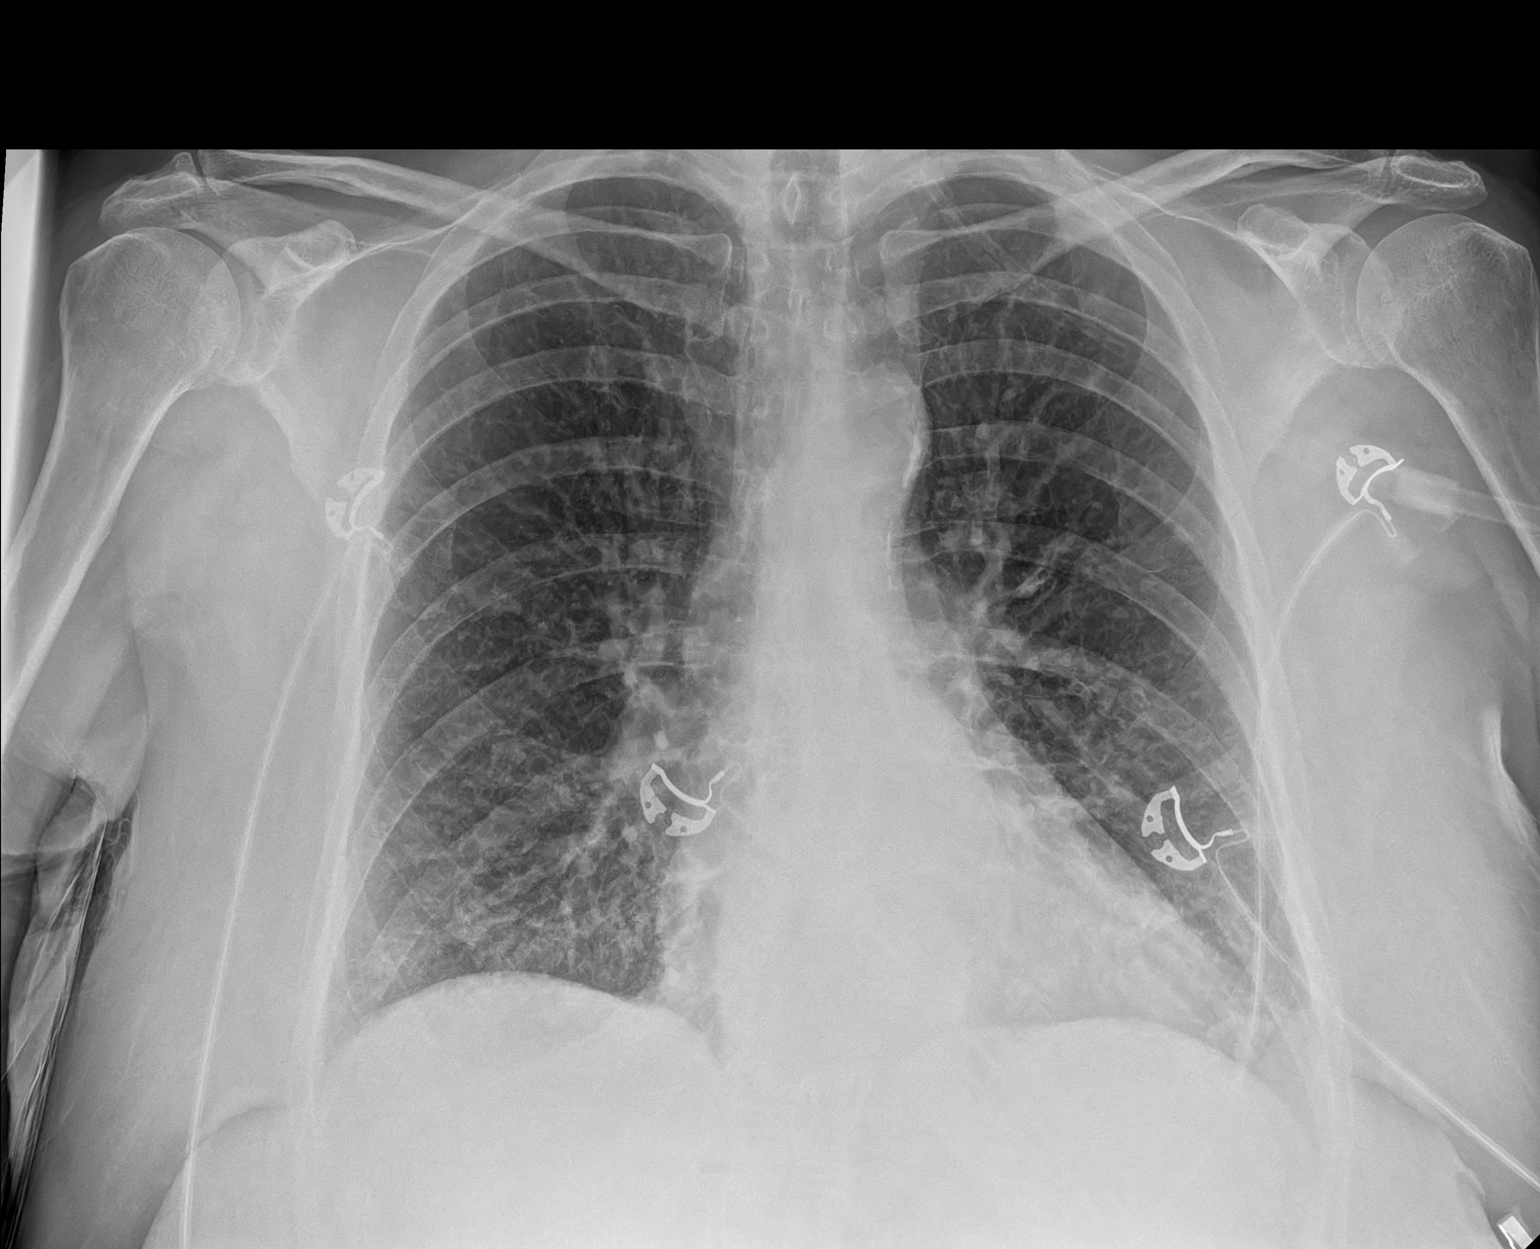

[1 of 1 positions shown; findings below may reference images not displayed]

FINDINGS: The lungs demonstrate mild emphysematous and chronic interstitial
changes without evidence of focal pneumonia.

There is increased opacity in the lateral left base which could be
atelectasis or small pneumonia, underlying mass not strictly
excluded. Consider CT follow-up.

The aorta is tortuous with patchy calcifications, stable mediastinum
with normal heart size.

There is a moderate-sized hiatal hernia. The thoracic cage grossly
intact.
IMPRESSION: 1. COPD with increased left lateral basal opacity which could be
atelectasis, a small infiltrate or mass. Chest CT recommended
preferably with contrast.
2. Aortic atherosclerosis.
